# Patient Record
Sex: Male | Born: 1960
Health system: Southern US, Community
[De-identification: ages and names within clinical notes are randomized; demographics above are authoritative.]

## PROBLEM LIST (undated history)

## (undated) DIAGNOSIS — I10 Essential (primary) hypertension: Secondary | ICD-10-CM

## (undated) HISTORY — PX: APPENDECTOMY: SHX54

## (undated) HISTORY — PX: TONSILLECTOMY: SUR1361

---

## 2004-05-13 ENCOUNTER — Ambulatory Visit (HOSPITAL_COMMUNITY): Admission: RE | Admit: 2004-05-13 | Discharge: 2004-05-13 | Payer: Self-pay | Admitting: Family Medicine

## 2005-05-05 ENCOUNTER — Ambulatory Visit (HOSPITAL_COMMUNITY): Admission: RE | Admit: 2005-05-05 | Discharge: 2005-05-05 | Payer: Self-pay | Admitting: Family Medicine

## 2007-02-25 ENCOUNTER — Emergency Department (HOSPITAL_COMMUNITY): Admission: EM | Admit: 2007-02-25 | Discharge: 2007-02-25 | Payer: Self-pay | Admitting: Emergency Medicine

## 2008-11-11 ENCOUNTER — Observation Stay (HOSPITAL_COMMUNITY): Admission: EM | Admit: 2008-11-11 | Discharge: 2008-11-12 | Payer: Self-pay | Admitting: Emergency Medicine

## 2010-12-18 ENCOUNTER — Emergency Department (HOSPITAL_COMMUNITY)
Admission: EM | Admit: 2010-12-18 | Discharge: 2010-12-18 | Disposition: A | Discharge status: Home / Self Care | Payer: BC Managed Care – PPO | Attending: Emergency Medicine | Admitting: Emergency Medicine

## 2010-12-18 ENCOUNTER — Emergency Department (HOSPITAL_COMMUNITY): Payer: BC Managed Care – PPO

## 2010-12-18 DIAGNOSIS — R079 Chest pain, unspecified: Secondary | ICD-10-CM | POA: Insufficient documentation

## 2010-12-18 DIAGNOSIS — R1013 Epigastric pain: Secondary | ICD-10-CM | POA: Insufficient documentation

## 2010-12-18 LAB — BASIC METABOLIC PANEL
Chloride: 97 mEq/L (ref 96–112)
Creatinine, Ser: 0.83 mg/dL (ref 0.4–1.5)
GFR calc non Af Amer: >60 mL/min (ref >60)

## 2010-12-18 LAB — LIPASE, BLOOD: Lipase: 20 U/L (ref 11–59)

## 2010-12-18 LAB — DIFFERENTIAL
Basophils Absolute: 0 10*3/uL (ref 0.0–0.1)
Basophils Relative: 0 % (ref 0–1)
Lymphocytes Relative: 25 % (ref 12–46)
Neutro Abs: 6.3 10*3/uL (ref 1.7–7.7)

## 2010-12-18 LAB — POCT CARDIAC MARKERS
Myoglobin, poc: 79.3 ng/mL (ref 12–200)
Myoglobin, poc: 68.7 ng/mL (ref 12–200)

## 2010-12-18 LAB — HEPATIC FUNCTION PANEL
ALT: 25 U/L (ref 0–53)
AST: 22 U/L (ref 0–37)
Albumin: 3.8 g/dL (ref 3.5–5.2)
Indirect Bilirubin: 0.5 mg/dL (ref 0.3–0.9)
Total Protein: 6.9 g/dL (ref 6.0–8.3)

## 2010-12-18 LAB — CBC
HCT: 42.2 % (ref 39.0–52.0)
Hemoglobin: 14.7 g/dL (ref 13.0–17.0)
RBC: 4.95 MIL/uL (ref 4.22–5.81)
RDW: 12.8 % (ref 11.5–15.5)
WBC: 10 10*3/uL (ref 4.0–10.5)

## 2011-01-11 LAB — POCT CARDIAC MARKERS
CKMB, poc: <1 ng/mL — ABNORMAL LOW (ref 1.0–8.0)
Myoglobin, poc: 46.5 ng/mL (ref 12–200)
Myoglobin, poc: 52.9 ng/mL (ref 12–200)
Troponin i, poc: <0.05 ng/mL (ref 0.00–0.09)

## 2011-01-11 LAB — DIFFERENTIAL
Basophils Absolute: 0 10*3/uL (ref 0.0–0.1)
Basophils Relative: 1 % (ref 0–1)
Eosinophils Relative: 2 % (ref 0–5)
Lymphs Abs: 2.9 10*3/uL (ref 0.7–4.0)
Monocytes Absolute: 0.9 10*3/uL (ref 0.1–1.0)
Neutrophils Relative %: 59 % (ref 43–77)

## 2011-01-11 LAB — COMPREHENSIVE METABOLIC PANEL
Albumin: 3.9 g/dL (ref 3.5–5.2)
BUN: 12 mg/dL (ref 6–23)
Creatinine, Ser: 0.7 mg/dL (ref 0.4–1.5)
GFR calc non Af Amer: >60 mL/min (ref >60)
Potassium: 4 mEq/L (ref 3.5–5.1)
Total Protein: 7.3 g/dL (ref 6.0–8.3)

## 2011-01-11 LAB — TROPONIN I: Troponin I: <0.01 ng/mL (ref 0.00–0.06)

## 2011-01-11 LAB — LIPID PANEL
Cholesterol: 180 mg/dL (ref 0–200)
HDL: 26 mg/dL — ABNORMAL LOW (ref >39)
Total CHOL/HDL Ratio: 6.9 RATIO
Triglycerides: 171 mg/dL — ABNORMAL HIGH (ref <150)

## 2011-01-11 LAB — CBC
Hemoglobin: 14.7 g/dL (ref 13.0–17.0)
Hemoglobin: 14.8 g/dL (ref 13.0–17.0)
MCHC: 34 g/dL (ref 30.0–36.0)
MCHC: 34.5 g/dL (ref 30.0–36.0)
MCV: 87.2 fL (ref 78.0–100.0)
RBC: 4.88 MIL/uL (ref 4.22–5.81)
RBC: 4.89 MIL/uL (ref 4.22–5.81)

## 2011-01-11 LAB — TSH: TSH: 0.658 u[IU]/mL (ref 0.350–4.500)

## 2011-01-11 LAB — POCT I-STAT, CHEM 8
BUN: 13 mg/dL (ref 6–23)
Calcium, Ion: 1.2 mmol/L (ref 1.12–1.32)
Chloride: 103 mEq/L (ref 96–112)
HCT: 44 % (ref 39.0–52.0)
Potassium: 3.8 mEq/L (ref 3.5–5.1)
Sodium: 141 mEq/L (ref 135–145)

## 2011-01-11 LAB — CARDIAC PANEL(CRET KIN+CKTOT+MB+TROPI)
Relative Index: INVALID (ref 0.0–2.5)
Total CK: 71 U/L (ref 7–232)

## 2011-01-11 LAB — PROTIME-INR
INR: 1 (ref 0.00–1.49)
Prothrombin Time: 12.9 seconds (ref 11.6–15.2)

## 2011-01-11 LAB — BASIC METABOLIC PANEL
CO2: 30 mEq/L (ref 19–32)
Calcium: 9.2 mg/dL (ref 8.4–10.5)
Creatinine, Ser: 0.73 mg/dL (ref 0.4–1.5)
Glucose, Bld: 107 mg/dL — ABNORMAL HIGH (ref 70–99)

## 2011-01-11 LAB — URINALYSIS, ROUTINE W REFLEX MICROSCOPIC
Bilirubin Urine: NEGATIVE
Hgb urine dipstick: NEGATIVE
Protein, ur: NEGATIVE mg/dL
Urobilinogen, UA: 0.2 mg/dL (ref 0.0–1.0)

## 2011-01-11 LAB — MAGNESIUM: Magnesium: 2.2 mg/dL (ref 1.5–2.5)

## 2011-01-11 LAB — D-DIMER, QUANTITATIVE: D-Dimer, Quant: <0.22 ug/mL-FEU (ref 0.00–0.48)

## 2011-01-11 LAB — BRAIN NATRIURETIC PEPTIDE: Pro B Natriuretic peptide (BNP): <30 pg/mL (ref 0.0–100.0)

## 2011-01-11 LAB — HEMOGLOBIN A1C: Hgb A1c MFr Bld: 5.5 % (ref 4.6–6.1)

## 2011-02-08 NOTE — H&P (Signed)
NAMEARTHURO, CANELO              ACCOUNT NO.:  [id number]   MEDICAL RECORD NO.:  [id number]          PATIENT TYPE:  OBV   LOCATION:  1843                         FACILITY:  [facility]   PHYSICIAN:  Richard A. Alanda Amass, M.D.DATE OF BIRTH:  November 16, 1960   DATE OF ADMISSION:  11/11/2008  DATE OF DISCHARGE:                              HISTORY & PHYSICAL   CHIEF COMPLAINT:  Cold symptoms and chest pain.   HISTORY OF PRESENT ILLNESS:  A 50 year old white married male presented  to Dr. Sharyon Medicus office today secondary to cold symptoms and hypertension.  He has had a cold since Friday and started Zithromax pack on Sunday and  went to work today at [employer] for [employer].  He had a headache.  Blood pressure was found to be elevated at 170/104.  They sent him to  Dr. Sharyon Medicus office for further evaluation.  There was told he had  probable sinusitis type upper respiratory.  He was given a breathing  treatment, and then Dr. Sherwood Gambler went to examine him, would take a deep  breath and he had this deep discomfort midsternal slightly to the right.  It ileum was only with deep breath.  No associated nausea, vomiting or  diaphoresis.  Because of that and some abnormal questionable J-point  elevation in leads V2 and V3, Dr. Tresa Endo was contacted, and the patient  was sent to the emergency room at [facility] by EMS today.  He received  3 baby aspirin at Dr. Sharyon Medicus office.  He had already taken 1 baby  aspirin earlier today.  Other recent history, he has been somewhat short  of breath with exertion which he relates to deconditioning.  He had lost  some weight and had been eating more appropriately but recently a grease  and not exercising.   PAST MEDICAL HISTORY:  He is legally blind.  He has been evaluated at  [facility].  They are unsure why he is losing eyesight since 2002.  They  had done a stress test at some point, and he was never told he had a  problem.  He also does have hypertension, and his total  cholesterol was  203.  He was put on medications but he has never taken them.  No  diabetes.  No CVAs.   PAST SURGICAL HISTORY:  Includes appendectomy, tonsils and adenoids as a  teenager.  He had a fractured skull.  He had [facility] Spotted  Fever but otherwise fairly healthy except for his hypertension and  tobacco use.  Also, he does have ADHD and is on medication to help him  focus.   FAMILY HISTORY:  Mother is living; she has hypertension.  No coronary  disease.  Father did have an MI at 4; history of CVAs and hypertension  and diabetes; he died in his 58s.  He has 1 brothers with hypertension  and 1 sister who at some point had a murmur but currently is stable.   SOCIAL HISTORY:  Married.  No children.  Does not use any illicit drugs.  No alcohol use.  He does smoke 1 pack per  day since he was a teenager  works.  Works at [employer] for [employer], but it is light physical  work.  Again, he was exercising but has not in approximately 1 year.   ALLERGIES:  No known allergies.   OUTPATIENT MEDICINES:  1. Centrum 1 daily.  2. Strattera 60 mg daily.  3. Lisinopril HCT 20/12.5 b.i.d.  4. Z-Pak this week.  5. Aspirin 81 mg daily.  6. Additionally, he did take some Sudafed on Saturday.   REVIEW OF SYSTEMS:  GENERAL:  Some cold symptoms and headache with  hypertension, history of headaches as well.  HEENT:  Cold symptoms.  NEUROLOGICAL:  No syncope but positive headache.  GASTROINTESTINAL:  No  melena, rare indigestion, no diarrhea or constipation.  GENITOURINARY:  No hematuria, dysuria.  MUSCULOSKELETAL:  Negative for arthritic pains.  ENDOCRINE:  No diabetes or thyroid disease that he is aware of.  SKIN:  Without rashes.  CARDIOVASCULAR:  As stated.  PULMONARY:  As stated.   PHYSICAL EXAMINATION:  Temperature 96.6, pulse 86, respiratory rate 18,  blood pressure 136/90 here at [facility].  Oxygen saturation room air  was 100%.  GENERAL:  Alert, oriented, white male in  no acute distress, pleasant  affect.  HEENT:  Normocephalic.  Sclera clear.  Nose is congested sounding.  No  color in mucous.  Will return to evaluate TMs and pharynx once otoscope  can be attained in the ER.  NECK:  Supple.  No JVD.  No bruits.  LUNGS:  Without wheezes currently.  HEART:  Sounds S1-S2, regular rate and rhythm without murmur, gallop,  rub or click.  ABDOMEN:  Soft, nontender, positive bowel sounds.  Could not palpate  liver, spleen or masses.  LOWER EXTREMITIES:  Without edema.  Pedal pulses are 2+.   LABORATORY VALUES:  Pro time 12.9, INR of 1.6.  Sodium 141, potassium  3.8, BUN 13, creatinine 0.90, glucose 93.  Myoglobin 52.9, CK-MB less  1.0, troponin I less than 0.05.  EKG:  Sinus rhythm with early  repolarization, both at Dr. Sharyon Medicus office and one here in the emergency  room.  Will repeat the one in the emergency room to verify no acute  changes.   IMPRESSION:  1. Chest pain, rule out cardiac versus pleural pain.  2. Sinusitis/upper respiratory infection.  3. Hypertension.   Admit to telemetry bed.  IV heparin, IV nitroglycerin, serial CK-MBs and  Avelox p.o. daily.  If cardiac enzymes are negative, decisions will be  made on February 17 whether cardiac catheterization or stress test as an  outpatient.   Dr. Alanda Amass saw him and assessed him with me.      Darcella Gasman. [place], N.P.      Richard A. Alanda Amass, M.D.  Electronically Signed    LRI/MEDQ  D:  11/11/2008  T:  11/11/2008  Job:  [id number]   cc:   Gerlene Burdock A. Alanda Amass, M.D.  Madelin Rear. Sherwood Gambler, MD

## 2011-02-11 NOTE — Discharge Summary (Signed)
Frank White, Frank White              ACCOUNT NO.:  [id number]   MEDICAL RECORD NO.:  [id number]          PATIENT TYPE:  OBV   LOCATION:  2505                         FACILITY:  [facility]   PHYSICIAN:  Richard A. Alanda Amass, M.D.DATE OF BIRTH:  02-Jun-1961   DATE OF ADMISSION:  11/11/2008  DATE OF DISCHARGE:  11/12/2008                               DISCHARGE SUMMARY   DISCHARGE DIAGNOSES:  1. Chest pain pleuritic secondary to upper respiratory infection,      bronchitis.  2. Upper respiratory infection, bronchitis, treated with Avelox.  3. Positive tobacco use.  4. Hyperlipidemia.  5. Legally blind.  6. Family history of coronary artery disease.  7. Hypokalemia, replaced.  8. Hypertension.  9. Negative myocardial infarction and cardiac eval as outpatient.   DISCHARGE CONDITION:  Improved.   PROCEDURES:  None.   DISCHARGE MEDICATIONS:  1. Aspirin 81 mg daily.  2. Lisinopril and hydrochlorothiazide 20/12.5 twice a day.  3. Strattera 60 mg daily.  4. Multivitamin daily.  5. Butalbital/APAP 325 as needed for migraines.  Stop Z-Pak.  6. Avelox 400 mg 1 daily until gone.  7. Zocor 20 mg daily for cholesterol.  8. Protonix 40 mg daily.  9. Lopressor 50 mg tablet half a tablet twice a day for blood      pressure.  10.Flonase nasal spray 2 sprays each nostril daily for congestion.   DISCHARGE INSTRUCTIONS:  1. Return to work on November 17, 2008.  2. Low-sodium heart-healthy diet.  3. Increase activity slowly, otherwise no restrictions.  4. Followup with Dr. Alanda Amass in [place] on December 01, 2008, at 9:30      a.m.  5. You are scheduled for [facility] on Thursday, November 20, 2008, at 8:15 a.m.  Do not eat or drink after midnight or the night      before the test.  No caffeine the 2 days before the test.  No decaf      coffee either.  Do not wear strong smelling deodorant, no cologne.   HISTORY OF PRESENT ILLNESS:  Please see dictated H and P, a 50 year old  white  married male, presented to Dr. Sharyon Medicus office secondary to call  symptoms.  He took a deep breath during the exam and had midsternal  chest pain.  No associated symptoms.  He was sent to [facility] for  further evaluation due to the patient's hypertension,  hypercholesterolemia, and coronary artery disease.  He was seen and  examined by Dr. Alanda Amass.  We admitted him for observation and to rule  out MI.   PAST MEDICAL HISTORY:  1. Legally blind.  2. Recent upper respiratory infection.  3. History of hypercholesterolemia.  4. History of skull fracture.  5. History of [facility] spotted fever.  Stress test in the past      have been negative.   FAMILY HISTORY/SOCIAL HISTORY/REVIEW OF SYSTEMS:  See H and P.   PHYSICAL EXAMINATION AT DISCHARGE:  VITAL SIGNS:  Blood pressure 136/77,  pulse 61, respirations 20, temperature 97.3, and oxygen saturation on  room air 95%.  HEART:  S1  and S2.  Regular rate and rhythm.  LUNGS:  Positive for rhonchi.  ABDOMEN:  Positive bowel sounds, soft, and nontender.  EXTREMITIES:  No edema.   LABORATORY VALUES:  Hemoglobin 14.8, hematocrit 43.4, platelets 213, WBC  9.7, it did spike up to 14 prior to discharge, neutrophils 59, lymphs  30, monos 9, eos 2, and baso 1.  Protime 12.9, INR of 1, and PTT 33.  D-  dimer was negative at less than 0.22.  Sodium 141, potassium 3.8,  chloride 103, CO2 of 30, glucose 77, BUN 12, and creatinine 0.70.  Total  protein 7.3, albumin 3.9, AST 16, ALT 80, alkaline phos 64, total bili  0.5, magnesium 2.2, and glycohemoglobin 5.5.   Cardiac enzymes were all negative.  CK 57, 71, 64.  MB 0.8-0.7.  Troponin I less than 0.01.  BNP was less than 30.  Total cholesterol  180, triglycerides 171, HDL 26, and LDL 120.  TSH 0.658.  UA was clear.   EKG revealed sinus bradycardia without acute changes.   HOSPITAL COURSE:  The patient was admitted by Dr. Alanda Amass to rule out  MI.  He was placed on nitroglycerin drip and  Lovenox.  By the next  morning, his cardiac enzymes were negative.  He did not have any further  chest pain and nitro was discontinued.  He was ambulated in the hall.  He had tobacco cessation consult as well and he was without complaints  later in the day and was discharged home by Dr. Elsie Lincoln.  He will follow  up with Dr. Alanda Amass as instructed.      Darcella Gasman. [place], N.P.      Richard A. Alanda Amass, M.D.  Electronically Signed    LRI/MEDQ  D:  12/02/2008  T:  12/03/2008  Job:  [id number]   cc:   Madelin Rear. Sherwood Gambler, MD

## 2012-09-04 ENCOUNTER — Emergency Department (HOSPITAL_COMMUNITY): Payer: BC Managed Care – PPO

## 2012-09-04 ENCOUNTER — Encounter (HOSPITAL_COMMUNITY): Payer: Self-pay

## 2012-09-04 ENCOUNTER — Emergency Department (HOSPITAL_COMMUNITY)
Admission: EM | Admit: 2012-09-04 | Discharge: 2012-09-04 | Disposition: A | Discharge status: Home / Self Care | Payer: BC Managed Care – PPO | Attending: Emergency Medicine | Admitting: Emergency Medicine

## 2012-09-04 DIAGNOSIS — IMO0002 Reserved for concepts with insufficient information to code with codable children: Secondary | ICD-10-CM | POA: Insufficient documentation

## 2012-09-04 DIAGNOSIS — Y9389 Activity, other specified: Secondary | ICD-10-CM | POA: Insufficient documentation

## 2012-09-04 DIAGNOSIS — W298XXA Contact with other powered powered hand tools and household machinery, initial encounter: Secondary | ICD-10-CM | POA: Insufficient documentation

## 2012-09-04 DIAGNOSIS — Y929 Unspecified place or not applicable: Secondary | ICD-10-CM | POA: Insufficient documentation

## 2012-09-04 DIAGNOSIS — R110 Nausea: Secondary | ICD-10-CM | POA: Insufficient documentation

## 2012-09-04 DIAGNOSIS — I10 Essential (primary) hypertension: Secondary | ICD-10-CM | POA: Insufficient documentation

## 2012-09-04 DIAGNOSIS — R61 Generalized hyperhidrosis: Secondary | ICD-10-CM | POA: Insufficient documentation

## 2012-09-04 DIAGNOSIS — F17200 Nicotine dependence, unspecified, uncomplicated: Secondary | ICD-10-CM | POA: Insufficient documentation

## 2012-09-04 DIAGNOSIS — Z23 Encounter for immunization: Secondary | ICD-10-CM | POA: Insufficient documentation

## 2012-09-04 HISTORY — DX: Essential (primary) hypertension: I10

## 2012-09-04 MED ORDER — TETANUS-DIPHTH-ACELL PERTUSSIS 5-2.5-18.5 LF-MCG/0.5 IM SUSP
0.5000 mL | Freq: Once | INTRAMUSCULAR | Status: AC
  Administered 2012-09-04: 0.5 mL via INTRAMUSCULAR
  Filled 2012-09-04: qty 0.5

## 2012-09-04 MED ORDER — OXYCODONE-ACETAMINOPHEN 5-325 MG PO TABS: 2.0000 | ORAL_TABLET | ORAL | Status: DC | PRN

## 2012-09-04 MED ORDER — CEPHALEXIN 500 MG PO CAPS: 500.0000 mg | ORAL_CAPSULE | Freq: Four times a day (QID) | ORAL | Status: DC

## 2012-09-04 MED ORDER — FENTANYL CITRATE 0.05 MG/ML IJ SOLN
50.0000 ug | Freq: Once | INTRAMUSCULAR | Status: AC
  Administered 2012-09-04: 50 ug via INTRAVENOUS
  Filled 2012-09-04: qty 2

## 2012-09-04 MED ORDER — CEFAZOLIN SODIUM 1-5 GM-% IV SOLN
1.0000 g | Freq: Once | INTRAVENOUS | Status: AC
  Administered 2012-09-04: 1 g via INTRAVENOUS
  Filled 2012-09-04: qty 50

## 2012-09-04 MED ORDER — SODIUM CHLORIDE 0.9 % IV BOLUS (SEPSIS)
1000.0000 mL | Freq: Once | INTRAVENOUS | Status: AC
  Administered 2012-09-04: 1000 mL via INTRAVENOUS

## 2012-09-04 NOTE — ED Provider Notes (Signed)
History     CSN: [id number]  Arrival date & time 09/04/12  2011   First MD Initiated Contact with Patient 09/04/12 2022      Chief Complaint  Patient presents with  . Finger Laceration      Patient is a 51 y.o. male presenting with hand pain. The history is provided by the patient.  Hand Pain This is a new problem. The current episode started less than 1 hour ago. The problem occurs constantly. The problem has not changed since onset.Pertinent negatives include no abdominal pain and no shortness of breath. Nothing aggravates the symptoms. Nothing relieves the symptoms. He has tried rest for the symptoms. The treatment provided mild relief.  PT reports he cut his left middle finger with a table saw He reports prior to the injury he felt well  He now reports nausea/diaphoresis after seeing the blood. He is not on anticoagulants  Past Medical History  Diagnosis Date  . Hypertension     History reviewed. No pertinent past surgical history.  No family history on file.  History  Substance Use Topics  . Smoking status: Current Every Day Smoker    Types: Cigarettes  . Smokeless tobacco: Not on file  . Alcohol Use: No      Review of Systems  Constitutional: Positive for diaphoresis. Negative for fever.  Respiratory: Negative for shortness of breath.   Gastrointestinal: Negative for abdominal pain.  Musculoskeletal: Negative for joint swelling.  Skin: Positive for wound.  Neurological: Positive for dizziness. Negative for weakness.  Psychiatric/Behavioral: Negative for agitation.  All other systems reviewed and are negative.    Allergies  Review of patient's allergies indicates no known allergies.  Home Medications  No current outpatient prescriptions on file.  BP 91/57  Pulse 44  Temp 97.8 F (36.6 C) (Oral)  Resp 20  Ht 5\' 9"  (1.753 m)  Wt 184 lb (83.462 kg)  BMI 27.17 kg/m2  SpO2 98%  Physical Exam CONSTITUTIONAL: Well developed/well nourished HEAD AND  FACE: Normocephalic/atraumatic EYES: EOMI/PERRL ENMT: Mucous membranes moist NECK: supple no meningeal signs CV: S1/S2 noted, no murmurs/rubs/gallops noted LUNGS: Lungs are clear to auscultation bilaterally, no apparent distress ABDOMEN: soft, nontender, no rebound or guarding GU:no cva tenderness NEURO: Pt is awake/alert, moves all extremitiesx4 EXTREMITIES: pulses normal, full ROM Laceration with partial amputation of distal tip of left middle finger.  He has part of his nail remaining.  Bleeding controlled.  No bony tenderness to the rest of the finger.  No other hand injury is noted SKIN: warm, color normal PSYCH: no abnormalities of mood noted  ED Course  Procedures   8:54 PM Pt with injury to left middle finger, bleeding controlled Xray pending He does not want pain meds 10:03 PM Pt stable Vitals improved Seen by dr Romeo Apple but he would like consultation with [facility] signout, f/u with consultant for disposition   MDM  Nursing notes including past medical history and social history reviewed and considered in documentation xrays reviewed and considered         Joya Gaskins, MD 09/04/12 2204

## 2012-09-04 NOTE — ED Notes (Addendum)
Using a table saw and cut the end of my left 3rd finger off per pt. Bleeding controlled at this time. Patient diaphoretic and feels like he is going to pass out. Patient does not have the end of his finger with him.

## 2012-09-04 NOTE — ED Provider Notes (Signed)
Discussed with Dr. Romeo Apple. Patient will be seen by him tomorrow. He recommends Xeroform dressing, splint, pain medication and antibiotics. Patient in agreement.  Glynn Octave, MD 09/04/12 [phone]

## 2012-09-05 ENCOUNTER — Ambulatory Visit (INDEPENDENT_AMBULATORY_CARE_PROVIDER_SITE_OTHER): Payer: BC Managed Care – PPO | Admitting: Orthopedic Surgery

## 2012-09-05 ENCOUNTER — Encounter: Payer: BC Managed Care – PPO | Admitting: Orthopedic Surgery

## 2012-09-05 ENCOUNTER — Encounter (HOSPITAL_COMMUNITY): Payer: Self-pay | Admitting: Pharmacy Technician

## 2012-09-05 ENCOUNTER — Encounter: Payer: Self-pay | Admitting: Orthopedic Surgery

## 2012-09-05 ENCOUNTER — Telehealth: Payer: Self-pay | Admitting: Orthopedic Surgery

## 2012-09-05 VITALS — BP 130/88 | Ht 69.0 in | Wt 187.0 lb

## 2012-09-05 DIAGNOSIS — S68119A Complete traumatic metacarpophalangeal amputation of unspecified finger, initial encounter: Secondary | ICD-10-CM

## 2012-09-05 DIAGNOSIS — IMO0002 Reserved for concepts with insufficient information to code with codable children: Secondary | ICD-10-CM

## 2012-09-05 DIAGNOSIS — S52509A Unspecified fracture of the lower end of unspecified radius, initial encounter for closed fracture: Secondary | ICD-10-CM

## 2012-09-05 NOTE — Telephone Encounter (Signed)
Regarding out-patient surgery scheduled 09/07/12 at [facility], contacted insurer [facility], ph# [phone].  Reference CPT codes 19147, 26951, with ICD9 code 886.0 - Per Silvio Clayman, no pre-authorization is required for out-patient procedures listed.  Her name and date, 09/05/12, 1:52p.m.

## 2012-09-05 NOTE — Patient Instructions (Signed)
   You have been scheduled for surgery on the LEFT long finger to revise the partial fingertip amputation.  Potential complications of the surgery include, but are not limited to, altered sensation, altered flexion and extension of the digit, chronic pain, and cosmetic deformity, infection.  Please stop all blood thinners, which include medications, such as ibuprofen or Advil and aspirin.

## 2012-09-05 NOTE — Patient Instructions (Addendum)
20 [name]  09/05/2012   Your procedure is scheduled on:  09/07/2012  Report to [facility] at  615  AM.  Call this number if you have problems the morning of surgery: [phone]   Remember:   Do not eat food:After Midnight.  May have clear liquids:until Midnight .    Take these medicines the morning of surgery with A SIP OF WATER:  Percocet,lisinopril   Do not wear jewelry, make-up or nail polish.  Do not wear lotions, powders, or perfumes.   Do not shave 48 hours prior to surgery. Men may shave face and neck.  Do not bring valuables to the hospital.  Contacts, dentures or bridgework may not be worn into surgery.  Leave suitcase in the car. After surgery it may be brought to your room.  For patients admitted to the hospital, checkout time is 11:00 AM the day of discharge.   Patients discharged the day of surgery will not be allowed to drive home.  Name and phone number of your driver: family  Special Instructions: Shower using CHG 2 nights before surgery and the night before surgery.  If you shower the day of surgery use CHG.  Use special wash - you have one bottle of CHG for all showers.  You should use approximately 1/3 of the bottle for each shower.   Please read over the following fact sheets that you were given: Pain Booklet, Coughing and Deep Breathing, MRSA Information, Surgical Site Infection Prevention, Anesthesia Post-op Instructions and Care and Recovery After Surgery Fingertip Injuries and Amputations Fingertip injuries are common and often get injured because they are last to escape when pulling your hand out of harm's way. You have amputated (cut off) part of your finger. How this turns out depends largely on how much was amputated. If just the tip is amputated, often the end of the finger will grow back and the finger may return to much the same as it was before the injury.  If more of the finger is missing, your caregiver has done the best with the tissue  remaining to allow you to keep as much finger as is possible. Your caregiver after checking your injury has tried to leave you with a painless fingertip that has durable, feeling skin. If possible, your caregiver has tried to maintain the finger's length and appearance and preserve its fingernail.  Please read the instructions outlined below and refer to this sheet in the next few weeks. These instructions provide you with general information on caring for yourself. Your caregiver may also give you specific instructions. While your treatment has been done according to the most current medical practices available, unavoidable complications occasionally occur. If you have any problems or questions after discharge, please call your caregiver. HOME CARE INSTRUCTIONS   You may resume normal diet and activities as directed or allowed.  Keep your hand elevated above the level of your heart. This helps decrease pain and swelling.  Keep ice packs (or a bag of ice wrapped in a towel) on the injured area for 15 to 20 minutes, 3 to 4 times per day, for the first two days.  Change dressings if necessary or as directed.  Clean the wound daily or as directed.  Only take over-the-counter or prescription medicines for pain, discomfort, or fever as directed by your caregiver.  Keep appointments as directed. SEEK IMMEDIATE MEDICAL CARE IF:  You develop redness, swelling, numbness or increasing pain in the wound.  There is pus coming from the wound.  You develop an unexplained oral temperature above 102 F (38.9 C) or as your caregiver suggests.  There is a foul (bad) smell coming from the wound or dressing.  There is a breaking open of the wound (edges not staying together) after sutures or staples have been removed. MAKE SURE YOU:   Understand these instructions.  Will watch your condition.  Will get help right away if you are not doing well or get worse. Document Released: 08/03/2005 Document  Revised: 12/05/2011 Document Reviewed: 07/02/2008 [facility] Patient Information 2013 [place], [place]. PATIENT INSTRUCTIONS POST-ANESTHESIA  IMMEDIATELY FOLLOWING SURGERY:  Do not drive or operate machinery for the first twenty four hours after surgery.  Do not make any important decisions for twenty four hours after surgery or while taking narcotic pain medications or sedatives.  If you develop intractable nausea and vomiting or a severe headache please notify your doctor immediately.  FOLLOW-UP:  Please make an appointment with your surgeon as instructed. You do not need to follow up with anesthesia unless specifically instructed to do so.  WOUND CARE INSTRUCTIONS (if applicable):  Keep a dry clean dressing on the anesthesia/puncture wound site if there is drainage.  Once the wound has quit draining you may leave it open to air.  Generally you should leave the bandage intact for twenty four hours unless there is drainage.  If the epidural site drains for more than 36-48 hours please call the anesthesia department.  QUESTIONS?:  Please feel free to call your physician or the hospital operator if you have any questions, and they will be happy to assist you.

## 2012-09-05 NOTE — Progress Notes (Signed)
Patient ID: Frank White, male   DOB: Feb 12, 1961, 51 y.o.   MRN: [id number] Chief Complaint  Patient presents with  . Hand Pain    Injury to the left middle finger, DOI 09-04-12.    Date of injury was 09/04/2012  Patient right-hand-dominant injured with table saw left long finger partial amputation dorsal oblique fingertip injury. Dull 2/10 constant pain  Review of systems he reports easy bleeding although no evidence of that is noted clinically other review of systems are normal  History as recorded  Physical Exam(12)  Vital signs:   GENERAL: normal development   CDV: pulses are normal   Skin: normal  Lymph: nodes were not palpable/normal  Psychiatric: awake, alert and oriented  Neuro: normal sensation  MSK  Gait: No abnormalities on gait analysis 1 Inspection left long finger dorsal oblique fingertip injury nail approximately 50% amputated bone is at the tip of the wound. He has intact flexion and extension at the DIP joint no residual deformity. All joints are stable  His other hand is normal  Lower extremity exam  Ambulation is normal.  The right and left lower extremity:  Inspection and palpation revealed no tenderness or abnormality in alignment in the lower extremities. Range of motion is full.  Strength is grade 5.  All joints are stable.  Diagnosis left long finger dorsal oblique fingertip amputation  Plan is for bone resection and then advancement flap versus primary closure versus healing by secondary intention, left long finger  Patient made aware of potential complications and cosmetic deformity

## 2012-09-06 ENCOUNTER — Other Ambulatory Visit: Payer: Self-pay

## 2012-09-06 ENCOUNTER — Encounter (HOSPITAL_COMMUNITY)
Admission: RE | Admit: 2012-09-06 | Discharge: 2012-09-06 | Disposition: A | Discharge status: Home / Self Care | Payer: BC Managed Care – PPO | Source: Ambulatory Visit | Attending: Orthopedic Surgery | Admitting: Orthopedic Surgery

## 2012-09-06 ENCOUNTER — Encounter (HOSPITAL_COMMUNITY): Payer: Self-pay

## 2012-09-06 LAB — HEMOGLOBIN AND HEMATOCRIT, BLOOD
HCT: 40.5 % (ref 39.0–52.0)
Hemoglobin: 14 g/dL (ref 13.0–17.0)

## 2012-09-06 LAB — BASIC METABOLIC PANEL
CO2: 28 mEq/L (ref 19–32)
Chloride: 105 mEq/L (ref 96–112)
Potassium: 4.3 mEq/L (ref 3.5–5.1)
Sodium: 140 mEq/L (ref 135–145)

## 2012-09-06 LAB — SURGICAL PCR SCREEN: Staphylococcus aureus: POSITIVE — AB

## 2012-09-06 NOTE — H&P (Signed)
  Chief Complaint   Patient presents with   .  Hand Pain     Injury to the left middle finger, DOI 09-04-12.    Date of injury was 09/04/2012   Patient right-hand-dominant injured with table saw left long finger partial amputation dorsal oblique fingertip injury. Dull 2/10 constant pain   Review of systems he reports easy bleeding although no evidence of that is noted clinically other review of systems are normal   Past Medical History  Diagnosis Date  . Hypertension     Past Surgical History  Procedure Date  . Appendectomy   . Tonsillectomy     Family History  Problem Relation Age of Onset  . Diabetes     History   Social History  . Marital Status: Married    Spouse Name: N/A    Number of Children: N/A  . Years of Education: N/A   Occupational History  . Not on file.   Social History Main Topics  . Smoking status: Current Every Day Smoker -- 1.0 packs/day    Types: Cigarettes  . Smokeless tobacco: Not on file  . Alcohol Use: No  . Drug Use: No  . Sexually Active:    Other Topics Concern  . Not on file   Social History Narrative  . No narrative on file   Physical Exam(12)  Vital signs are normal  GENERAL: normal development  CDV: pulses are normal  Skin: normal  Lymph: nodes were not palpable/normal  Psychiatric: awake, alert and oriented  Neuro: normal sensation  MSK Gait: No abnormalities on gait analysis  1 Inspection left long finger dorsal oblique fingertip injury nail approximately 50% amputated bone is at the tip of the wound. He has intact flexion and extension at the DIP joint no residual deformity. All joints are stable  His other hand is normal  Lower extremity exam  Ambulation is normal.  The right and left lower extremity:  Inspection and palpation revealed no tenderness or abnormality in alignment in the lower extremities.  Range of motion is full. Strength is grade 5.  All joints are stable.   Diagnosis left long finger dorsal  oblique fingertip amputation   Plan is for bone resection and then advancement flap versus primary closure versus healing by secondary intention, left long finger   Patient made aware of potential complications and cosmetic deformity

## 2012-09-07 ENCOUNTER — Encounter (HOSPITAL_COMMUNITY): Payer: Self-pay | Admitting: Anesthesiology

## 2012-09-07 ENCOUNTER — Ambulatory Visit (HOSPITAL_COMMUNITY): Payer: BC Managed Care – PPO | Admitting: Anesthesiology

## 2012-09-07 ENCOUNTER — Encounter (HOSPITAL_COMMUNITY): Payer: Self-pay | Admitting: *Deleted

## 2012-09-07 ENCOUNTER — Encounter (HOSPITAL_COMMUNITY)
Admission: RE | Disposition: A | Discharge status: Home / Self Care | Payer: Self-pay | Source: Ambulatory Visit | Attending: Orthopedic Surgery

## 2012-09-07 ENCOUNTER — Ambulatory Visit (HOSPITAL_COMMUNITY)
Admission: RE | Admit: 2012-09-07 | Discharge: 2012-09-07 | Disposition: A | Discharge status: Home / Self Care | Payer: BC Managed Care – PPO | Source: Ambulatory Visit | Attending: Orthopedic Surgery | Admitting: Orthopedic Surgery

## 2012-09-07 DIAGNOSIS — Z01810 Encounter for preprocedural cardiovascular examination: Secondary | ICD-10-CM | POA: Insufficient documentation

## 2012-09-07 DIAGNOSIS — I10 Essential (primary) hypertension: Secondary | ICD-10-CM | POA: Insufficient documentation

## 2012-09-07 DIAGNOSIS — Z01812 Encounter for preprocedural laboratory examination: Secondary | ICD-10-CM | POA: Insufficient documentation

## 2012-09-07 DIAGNOSIS — IMO0002 Reserved for concepts with insufficient information to code with codable children: Secondary | ICD-10-CM | POA: Insufficient documentation

## 2012-09-07 DIAGNOSIS — W298XXA Contact with other powered powered hand tools and household machinery, initial encounter: Secondary | ICD-10-CM | POA: Insufficient documentation

## 2012-09-07 DIAGNOSIS — S68119A Complete traumatic metacarpophalangeal amputation of unspecified finger, initial encounter: Secondary | ICD-10-CM

## 2012-09-07 HISTORY — PX: AMPUTATION: SHX166

## 2012-09-07 SURGERY — AMPUTATION DIGIT
Anesthesia: Regional | Site: Finger | Laterality: Left | Wound class: Clean

## 2012-09-07 MED ORDER — LIDOCAINE HCL (PF) 0.5 % IJ SOLN
INTRAMUSCULAR | Status: DC | PRN
  Administered 2012-09-07: 50 mL

## 2012-09-07 MED ORDER — FENTANYL CITRATE 0.05 MG/ML IJ SOLN
INTRAMUSCULAR | Status: AC
  Filled 2012-09-07: qty 2

## 2012-09-07 MED ORDER — MUPIROCIN 2 % EX OINT
TOPICAL_OINTMENT | Freq: Two times a day (BID) | CUTANEOUS | Status: DC
  Administered 2012-09-07: 1 via NASAL

## 2012-09-07 MED ORDER — FENTANYL CITRATE 0.05 MG/ML IJ SOLN
INTRAMUSCULAR | Status: DC | PRN
  Administered 2012-09-07: 100 ug via INTRAVENOUS

## 2012-09-07 MED ORDER — CEFAZOLIN SODIUM-DEXTROSE 2-3 GM-% IV SOLR
INTRAVENOUS | Status: AC
  Filled 2012-09-07: qty 50

## 2012-09-07 MED ORDER — SODIUM CHLORIDE 0.9 % IR SOLN
Status: DC | PRN
  Administered 2012-09-07: 1

## 2012-09-07 MED ORDER — LACTATED RINGERS IV SOLN
INTRAVENOUS | Status: DC | PRN
  Administered 2012-09-07: 07:00:00 via INTRAVENOUS

## 2012-09-07 MED ORDER — CHLORHEXIDINE GLUCONATE 4 % EX LIQD: 60.0000 mL | Freq: Once | CUTANEOUS | Status: DC

## 2012-09-07 MED ORDER — MUPIROCIN 2 % EX OINT
TOPICAL_OINTMENT | CUTANEOUS | Status: AC
  Filled 2012-09-07: qty 22

## 2012-09-07 MED ORDER — PROPOFOL 10 MG/ML IV EMUL
INTRAVENOUS | Status: AC
  Filled 2012-09-07: qty 20

## 2012-09-07 MED ORDER — BUPIVACAINE HCL 0.25 % IJ SOLN
INTRAMUSCULAR | Status: DC | PRN
  Administered 2012-09-07: 12 mL

## 2012-09-07 MED ORDER — CEFAZOLIN SODIUM-DEXTROSE 2-3 GM-% IV SOLR
INTRAVENOUS | Status: DC | PRN
  Administered 2012-09-07: 2 g via INTRAVENOUS

## 2012-09-07 MED ORDER — MIDAZOLAM HCL 2 MG/2ML IJ SOLN
INTRAMUSCULAR | Status: AC
  Filled 2012-09-07: qty 2

## 2012-09-07 MED ORDER — GLYCOPYRROLATE 0.2 MG/ML IJ SOLN
INTRAMUSCULAR | Status: AC
  Filled 2012-09-07: qty 1

## 2012-09-07 MED ORDER — ONDANSETRON HCL 4 MG/2ML IJ SOLN: 4.0000 mg | Freq: Once | INTRAMUSCULAR | Status: DC | PRN

## 2012-09-07 MED ORDER — OXYCODONE-ACETAMINOPHEN 5-325 MG PO TABS: 1.0000 | ORAL_TABLET | ORAL | Status: DC | PRN

## 2012-09-07 MED ORDER — CEFAZOLIN SODIUM-DEXTROSE 2-3 GM-% IV SOLR: 2.0000 g | INTRAVENOUS | Status: DC

## 2012-09-07 MED ORDER — GLYCOPYRROLATE 0.2 MG/ML IJ SOLN
0.2000 mg | Freq: Once | INTRAMUSCULAR | Status: AC
  Administered 2012-09-07: 0.2 mg via INTRAVENOUS

## 2012-09-07 MED ORDER — LACTATED RINGERS IV SOLN
INTRAVENOUS | Status: DC
  Administered 2012-09-07: 1000 mL via INTRAVENOUS

## 2012-09-07 MED ORDER — MIDAZOLAM HCL 2 MG/2ML IJ SOLN
1.0000 mg | INTRAMUSCULAR | Status: DC | PRN
  Administered 2012-09-07: 2 mg via INTRAVENOUS

## 2012-09-07 MED ORDER — LIDOCAINE HCL (CARDIAC) 10 MG/ML IV SOLN
INTRAVENOUS | Status: DC | PRN
  Administered 2012-09-07: 50 mg via INTRAVENOUS

## 2012-09-07 MED ORDER — PROPOFOL INFUSION 10 MG/ML OPTIME
INTRAVENOUS | Status: DC | PRN
  Administered 2012-09-07: 75 ug/kg/min via INTRAVENOUS

## 2012-09-07 MED ORDER — LIDOCAINE HCL (PF) 0.5 % IJ SOLN
INTRAMUSCULAR | Status: AC
  Filled 2012-09-07: qty 50

## 2012-09-07 MED ORDER — FENTANYL CITRATE 0.05 MG/ML IJ SOLN
25.0000 ug | INTRAMUSCULAR | Status: DC | PRN
  Administered 2012-09-07 (×2): 50 ug via INTRAVENOUS

## 2012-09-07 MED ORDER — SODIUM CHLORIDE 0.9 % IJ SOLN
INTRAMUSCULAR | Status: AC
  Filled 2012-09-07: qty 10

## 2012-09-07 MED ORDER — MIDAZOLAM HCL 5 MG/5ML IJ SOLN
INTRAMUSCULAR | Status: DC | PRN
  Administered 2012-09-07 (×2): 1 mg via INTRAVENOUS

## 2012-09-07 MED ORDER — BUPIVACAINE HCL (PF) 0.25 % IJ SOLN
INTRAMUSCULAR | Status: AC
  Filled 2012-09-07: qty 30

## 2012-09-07 MED ORDER — LIDOCAINE HCL (PF) 1 % IJ SOLN
INTRAMUSCULAR | Status: AC
  Filled 2012-09-07: qty 5

## 2012-09-07 SURGICAL SUPPLY — 39 items
BAG HAMPER (MISCELLANEOUS) ×2 IMPLANT
BANDAGE CONFORM 2  STR LF (GAUZE/BANDAGES/DRESSINGS) ×2 IMPLANT
BANDAGE ELASTIC 2 VELCRO NS LF (GAUZE/BANDAGES/DRESSINGS) ×1 IMPLANT
BANDAGE ESMARK 4X12 BL STRL LF (DISPOSABLE) ×1 IMPLANT
BANDAGE GAUZE ELAST BULKY 4 IN (GAUZE/BANDAGES/DRESSINGS) ×2 IMPLANT
BLADE SAW RECIPROCATING 77.5 (BLADE) ×1 IMPLANT
BNDG CMPR 12X4 ELC STRL LF (DISPOSABLE) ×1
BNDG ESMARK 4X12 BLUE STRL LF (DISPOSABLE) ×2
CHLORAPREP W/TINT 26ML (MISCELLANEOUS) ×1 IMPLANT
CLOTH BEACON ORANGE TIMEOUT ST (SAFETY) ×2 IMPLANT
COVER LIGHT HANDLE STERIS (MISCELLANEOUS) ×4 IMPLANT
CUFF TOURNIQUET SINGLE 18IN (TOURNIQUET CUFF) ×2 IMPLANT
DECANTER SPIKE VIAL GLASS SM (MISCELLANEOUS) ×1 IMPLANT
DRSG TEGADERM 2-3/8X2-3/4 SM (GAUZE/BANDAGES/DRESSINGS) ×1 IMPLANT
DRSG XEROFORM 1X8 (GAUZE/BANDAGES/DRESSINGS) ×1 IMPLANT
ELECT REM PT RETURN 9FT ADLT (ELECTROSURGICAL) ×2
ELECTRODE REM PT RTRN 9FT ADLT (ELECTROSURGICAL) ×1 IMPLANT
GLOVE BIOGEL PI IND STRL 7.0 (GLOVE) IMPLANT
GLOVE BIOGEL PI INDICATOR 7.0 (GLOVE) ×1
GLOVE ECLIPSE 6.5 STRL STRAW (GLOVE) ×1 IMPLANT
GLOVE SKINSENSE NS SZ8.0 LF (GLOVE) ×1
GLOVE SKINSENSE STRL SZ8.0 LF (GLOVE) ×1 IMPLANT
GLOVE SS N UNI LF 8.5 STRL (GLOVE) ×2 IMPLANT
GOWN STRL REIN XL XLG (GOWN DISPOSABLE) ×6 IMPLANT
HEMOSTAT SURGICEL 4X8 (HEMOSTASIS) ×1 IMPLANT
KIT ROOM TURNOVER APOR (KITS) ×2 IMPLANT
MANIFOLD NEPTUNE II (INSTRUMENTS) ×2 IMPLANT
NDL HYPO 21X1.5 SAFETY (NEEDLE) IMPLANT
NEEDLE HYPO 21X1.5 SAFETY (NEEDLE) ×2 IMPLANT
NS IRRIG 1000ML POUR BTL (IV SOLUTION) ×2 IMPLANT
PACK BASIC LIMB (CUSTOM PROCEDURE TRAY) ×2 IMPLANT
PAD ARMBOARD 7.5X6 YLW CONV (MISCELLANEOUS) ×2 IMPLANT
SET BASIN LINEN APH (SET/KITS/TRAYS/PACK) ×2 IMPLANT
SOL PREP PROV IODINE SCRUB 4OZ (MISCELLANEOUS) ×2 IMPLANT
SPONGE LAP 18X18 X RAY DECT (DISPOSABLE) ×2 IMPLANT
SUT BONE WAX W31G (SUTURE) ×2 IMPLANT
SUT ETHILON 3 0 FSL (SUTURE) ×2 IMPLANT
SYR 30ML LL (SYRINGE) ×1 IMPLANT
SYR BULB IRRIGATION 50ML (SYRINGE) ×2 IMPLANT

## 2012-09-07 NOTE — Transfer of Care (Signed)
  Anesthesia Post-op Note  Patient: Frank White  Procedure(s) Performed: Procedure(s) (LRB) with comments: AMPUTATION DIGIT (Left) - left long finger - revision of amputation   Patient Location: PACU  Anesthesia Type: Bier block  Level of Consciousness: awake, alert , oriented and patient cooperative  Airway and Oxygen Therapy: Patient Spontanous Breathing room air  Post-op Pain: mild  Post-op Assessment: Post-op Vital signs reviewed, Patient's Cardiovascular Status Stable, Respiratory Function Stable, Patent Airway and No signs of Nausea or vomiting  Post-op Vital Signs: Reviewed and stable  Complications: No apparent anesthesia complications

## 2012-09-07 NOTE — Interval H&P Note (Signed)
History and Physical Interval Note:  09/07/2012 7:23 AM  Frank White  has presented today for surgery, with the diagnosis of left long finger tip amputation  The various methods of treatment have been discussed with the patient and family. After consideration of risks, benefits and other options for treatment, the patient has consented to  Procedure(s) (LRB) with comments: AMPUTATION DIGIT (Left) - left long finger - revision of amputation  as a surgical intervention .  The patient's history has been reviewed, patient examined, no change in status, stable for surgery.  I have reviewed the patient's chart and labs.  Questions were answered to the patient's satisfaction.     Fuller Canada  LEFT LONG FINGER TIP REVISION OF AMP

## 2012-09-07 NOTE — Anesthesia Procedure Notes (Signed)
Anesthesia Regional Block:  Bier block (IV Regional)  Pre-Anesthetic Checklist: ,, timeout performed, Correct Patient, Correct Site, Correct Laterality, Correct Procedure,, site marked, surgical consent,, at surgeon's request  Laterality: Left  Prep: alcohol swabs       Needles:  Injection technique: Single-shot  Needle Type: Other      Needle Gauge: 22 and 22 G    Additional Needles: Bier block (IV Regional)  Nerve Stimulator or Paresthesia:   Additional Responses:  Pulse checked post tourniquet inflation. IV NSL discontinued post injection. Narrative:  Start time: 09/07/2012 7:52 AM  Performed by: Personally   Bier block (IV Regional)

## 2012-09-07 NOTE — Anesthesia Preprocedure Evaluation (Signed)
Anesthesia Evaluation  Patient identified by MRN, date of birth, ID band Patient awake    Reviewed: Allergy & Precautions, H&P , NPO status , Patient's Chart, lab work & pertinent test results  Airway Mallampati: I TM Distance: >3 FB Neck ROM: Full    Dental  (+) Missing   Pulmonary neg pulmonary ROS,    Pulmonary exam normal       Cardiovascular hypertension, Pt. on medications Rhythm:Regular Rate:Bradycardia     Neuro/Psych negative psych ROS   GI/Hepatic negative GI ROS, Neg liver ROS,   Endo/Other  negative endocrine ROS  Renal/GU negative Renal ROS  negative genitourinary   Musculoskeletal negative musculoskeletal ROS (+)   Abdominal Normal abdominal exam  (+)   Peds  Hematology negative hematology ROS (+)   Anesthesia Other Findings   Reproductive/Obstetrics                           Anesthesia Physical Anesthesia Plan  ASA: II  Anesthesia Plan: Bier Block   Post-op Pain Management:    Induction: Intravenous  Airway Management Planned: Nasal Cannula  Additional Equipment:   Intra-op Plan:   Post-operative Plan:   Informed Consent: I have reviewed the patients History and Physical, chart, labs and discussed the procedure including the risks, benefits and alternatives for the proposed anesthesia with the patient or authorized representative who has indicated his/her understanding and acceptance.     Plan Discussed with: CRNA  Anesthesia Plan Comments:         Anesthesia Quick Evaluation

## 2012-09-07 NOTE — Brief Op Note (Signed)
09/07/2012  8:21 AM  PATIENT:  Larena Glassman  51 y.o. male  PRE-OPERATIVE DIAGNOSIS:  left long finger tip amputation  POST-OPERATIVE DIAGNOSIS:  left long finger tip amputation  PROCEDURE:  Procedure(s) (LRB) with comments: AMPUTATION DIGIT (Left) - left long finger - revision of amputation   SURGEON:  Surgeon(s) and Role:    * Vickki Hearing, MD - Primary  PHYSICIAN ASSISTANT:   ASSISTANTS: none   ANESTHESIA:   regional  EBL:  Total I/O In: 1100 [I.V.:1100] Out: 0   BLOOD ADMINISTERED:none  DRAINS: none   LOCAL MEDICATIONS USED:  MARCAINE    and Amount: 12 ml  SPECIMEN:  No Specimen  DISPOSITION OF SPECIMEN:  N/A  COUNTS:  YES  TOURNIQUET:   Total Tourniquet Time Documented: Upper Arm (Left) - 30 minutes  DICTATION: .Dragon Dictation  PLAN OF CARE: Discharge to home after PACU  PATIENT DISPOSITION:  PACU - hemodynamically stable.   Delay start of Pharmacological VTE agent (>24hrs) due to surgical blood loss or risk of bleeding: not applicable

## 2012-09-07 NOTE — Anesthesia Postprocedure Evaluation (Signed)
  Anesthesia Post-op Note  Patient: Frank White  Procedure(s) Performed: Procedure(s) (LRB) with comments: AMPUTATION DIGIT (Left) - left long finger - revision of amputation   Patient Location: PACU  Anesthesia Type: MAC  Level of Consciousness: awake, alert , oriented and patient cooperative  Airway and Oxygen Therapy: Patient Spontanous Breathing  Room air  Post-op Pain: mild  Post-op Assessment: Post-op Vital signs reviewed, Patient's Cardiovascular Status Stable, Respiratory Function Stable, Patent Airway and No signs of Nausea or vomiting  Post-op Vital Signs: Reviewed and stable  Complications: No apparent anesthesia complications

## 2012-09-07 NOTE — Preoperative (Signed)
Beta Blockers   Reason not to administer Beta Blockers:Not Applicable 

## 2012-09-07 NOTE — Op Note (Signed)
Brief op note incorporated by reference  Details of procedure the left long finger was identified and marked in the preop area, proper charting update was completed  The patient was taken to the operating room and a Bier block was established. He was in the supine position.  After sterile prep and drape, timeout was completed  The partial amputation was irrigated and debrided the bone was Rogeured back to the appropriate level. Further irrigation and debridement was completed. The dorsal oblique and ulnar oblique fingertip was then freshened with sharp dissection and converted to a transverse amputation. 3 3-0 nylon sutures were then easily used to close the wound  Moist Xeroform dressing followed by moist 2 x 2 and dry 2 x 2 and 2 inch Kling was used for dressing with a finger cot placed over the top  Note we did use 12 cc of 0.5% plain Marcaine as a digital block  The tourniquet was released the patient was taken to the postoperative area for recovery  Followup in 6 days for dressing change

## 2012-09-10 ENCOUNTER — Encounter (HOSPITAL_COMMUNITY): Payer: Self-pay | Admitting: Orthopedic Surgery

## 2012-09-13 ENCOUNTER — Encounter: Payer: Self-pay | Admitting: Orthopedic Surgery

## 2012-09-13 ENCOUNTER — Ambulatory Visit (INDEPENDENT_AMBULATORY_CARE_PROVIDER_SITE_OTHER): Payer: BC Managed Care – PPO | Admitting: Orthopedic Surgery

## 2012-09-13 VITALS — BP 110/70 | Ht 69.0 in | Wt 187.0 lb

## 2012-09-13 DIAGNOSIS — S68119A Complete traumatic metacarpophalangeal amputation of unspecified finger, initial encounter: Secondary | ICD-10-CM

## 2012-09-13 DIAGNOSIS — IMO0002 Reserved for concepts with insufficient information to code with codable children: Secondary | ICD-10-CM

## 2012-09-13 NOTE — Patient Instructions (Signed)
Keep clean and dry

## 2012-09-13 NOTE — Progress Notes (Signed)
Patient ID: Frank White, male   DOB: 1961/07/24, 51 y.o.   MRN: [id number] Chief Complaint  Patient presents with  . Follow-up    post op check left hand    The patient had a partial amputation of his left long finger and had revision of amputation with wound closure on the 13th. He presents back today for further evaluation his first postop visit.  His incision looks good the suture line looks good the finger looks good the wound looks good  We recommend sutures to come out in a week and then start dressing changes through the hospital therapy department

## 2012-09-20 ENCOUNTER — Encounter: Payer: Self-pay | Admitting: Orthopedic Surgery

## 2012-09-20 ENCOUNTER — Ambulatory Visit (INDEPENDENT_AMBULATORY_CARE_PROVIDER_SITE_OTHER): Payer: BC Managed Care – PPO | Admitting: Orthopedic Surgery

## 2012-09-20 VITALS — BP 120/70 | Ht 69.0 in | Wt 187.0 lb

## 2012-09-20 DIAGNOSIS — S68119A Complete traumatic metacarpophalangeal amputation of unspecified finger, initial encounter: Secondary | ICD-10-CM

## 2012-09-20 DIAGNOSIS — IMO0002 Reserved for concepts with insufficient information to code with codable children: Secondary | ICD-10-CM

## 2012-09-20 NOTE — Progress Notes (Signed)
Patient ID: Frank White, male   DOB: 1961-09-02, 51 y.o.   MRN: [id number] Chief Complaint  Patient presents with  . Follow-up    post op and stitches removed/ DOS 12.13.2013    1. Traumatic amputation of fingertip     Diagnoses     Traumatic amputation of fingertip   - Primary    886.0      Reason for Visit     Follow-up    post op check left hand       Vitals - Last Recorded       BP  Ht  Wt  BMI          110/70  5\' 9"  (1.753 m)  187 lb (84.823 kg)  27.62 kg/m2           Progress Notes     Fuller Canada, MD  09/13/2012 2:38 PM  Signed  Patient ID: Frank White, male   DOB: Nov 14, 1960, 51 y.o.   MRN: [id number] Chief Complaint   Patient presents with   .  Follow-up       post op check left hand     The patient had a partial amputation of his left long finger and had revision of amputation with wound closure on the    13th of December  His wound looks good. His sutures are removed. He has hypersensitive tip. No tension was noted on the suture line has good color, normal capillary refill.  When he returns next time we can start dressing changes with soaking and simple Band-Aid covering.

## 2012-09-20 NOTE — Patient Instructions (Signed)
Keep clean and keep dry    

## 2012-09-25 ENCOUNTER — Ambulatory Visit (INDEPENDENT_AMBULATORY_CARE_PROVIDER_SITE_OTHER): Payer: BC Managed Care – PPO | Admitting: Orthopedic Surgery

## 2012-09-25 ENCOUNTER — Encounter: Payer: Self-pay | Admitting: Orthopedic Surgery

## 2012-09-25 VITALS — Ht 69.0 in | Wt 187.0 lb

## 2012-09-25 DIAGNOSIS — IMO0002 Reserved for concepts with insufficient information to code with codable children: Secondary | ICD-10-CM

## 2012-09-25 DIAGNOSIS — S68119A Complete traumatic metacarpophalangeal amputation of unspecified finger, initial encounter: Secondary | ICD-10-CM

## 2012-09-25 NOTE — Progress Notes (Signed)
Patient ID: Frank White, male   DOB: 02/28/1961, 51 y.o.   MRN: [id number] Chief Complaint  Patient presents with  . Follow-up    1 week recheck on left long finger with dressing change.//12.13.13    Fingertip amputation LEFT long finger doing well. Dressing change. He has a 3 x 6 mm area of granulating tissue. Mild stiffness at the DIP joint.  Recommend soaking daily dressing changes daily. Follow up in 4 weeks

## 2012-09-25 NOTE — Patient Instructions (Signed)
Start dressing changes daily: soak for 15 minutes in warm water and drop of dish detergent  Left dry and apply band-aids

## 2012-10-23 ENCOUNTER — Encounter: Payer: Self-pay | Admitting: Orthopedic Surgery

## 2012-10-23 ENCOUNTER — Ambulatory Visit (INDEPENDENT_AMBULATORY_CARE_PROVIDER_SITE_OTHER): Payer: BC Managed Care – PPO | Admitting: Orthopedic Surgery

## 2012-10-23 VITALS — BP 124/80 | Ht 69.0 in | Wt 187.0 lb

## 2012-10-23 DIAGNOSIS — IMO0002 Reserved for concepts with insufficient information to code with codable children: Secondary | ICD-10-CM

## 2012-10-23 DIAGNOSIS — S68119A Complete traumatic metacarpophalangeal amputation of unspecified finger, initial encounter: Secondary | ICD-10-CM

## 2012-10-23 NOTE — Progress Notes (Signed)
Patient ID: Frank White, male   DOB: [birth year], 52 y.o.   MRN: [id number] Chief Complaint  Patient presents with  . Follow-up    recheck left long finger amputation   1. Traumatic amputation of fingertip     The patient had surgery for partial amputation of his left long finger on 09/07/2012  He is doing well his nail is starting to undergo the changes as expected he has full range of motion at his DIP joint he has normal sensation no cold intolerance doing well followup as needed

## 2012-10-23 NOTE — Patient Instructions (Signed)
activities as tolerated 

## 2014-04-27 ENCOUNTER — Emergency Department (HOSPITAL_COMMUNITY): Payer: BC Managed Care – PPO

## 2014-04-27 ENCOUNTER — Encounter (HOSPITAL_COMMUNITY): Payer: Self-pay | Admitting: Emergency Medicine

## 2014-04-27 ENCOUNTER — Emergency Department (HOSPITAL_COMMUNITY)
Admission: EM | Admit: 2014-04-27 | Discharge: 2014-04-27 | Disposition: A | Discharge status: Home / Self Care | Payer: BC Managed Care – PPO | Attending: Emergency Medicine | Admitting: Emergency Medicine

## 2014-04-27 DIAGNOSIS — R05 Cough: Secondary | ICD-10-CM | POA: Insufficient documentation

## 2014-04-27 DIAGNOSIS — R059 Cough, unspecified: Secondary | ICD-10-CM | POA: Insufficient documentation

## 2014-04-27 DIAGNOSIS — I10 Essential (primary) hypertension: Secondary | ICD-10-CM | POA: Insufficient documentation

## 2014-04-27 DIAGNOSIS — R042 Hemoptysis: Secondary | ICD-10-CM

## 2014-04-27 DIAGNOSIS — F17200 Nicotine dependence, unspecified, uncomplicated: Secondary | ICD-10-CM | POA: Insufficient documentation

## 2014-04-27 DIAGNOSIS — Z792 Long term (current) use of antibiotics: Secondary | ICD-10-CM | POA: Insufficient documentation

## 2014-04-27 DIAGNOSIS — Z7982 Long term (current) use of aspirin: Secondary | ICD-10-CM | POA: Insufficient documentation

## 2014-04-27 LAB — COMPREHENSIVE METABOLIC PANEL
ALBUMIN: 3.8 g/dL (ref 3.5–5.2)
ALT: 20 U/L (ref 0–53)
ANION GAP: 10 (ref 5–15)
AST: 20 U/L (ref 0–37)
Alkaline Phosphatase: 54 U/L (ref 39–117)
BILIRUBIN TOTAL: 0.2 mg/dL — AB (ref 0.3–1.2)
BUN: 19 mg/dL (ref 6–23)
CALCIUM: 9.4 mg/dL (ref 8.4–10.5)
CO2: 29 mEq/L (ref 19–32)
CREATININE: 0.93 mg/dL (ref 0.50–1.35)
Chloride: 101 mEq/L (ref 96–112)
GFR calc Af Amer: >90 mL/min (ref >90)
Glucose, Bld: 123 mg/dL — ABNORMAL HIGH (ref 70–99)
Potassium: 3.3 mEq/L — ABNORMAL LOW (ref 3.7–5.3)
Sodium: 140 mEq/L (ref 137–147)
Total Protein: 7.5 g/dL (ref 6.0–8.3)

## 2014-04-27 LAB — CBC WITH DIFFERENTIAL/PLATELET
BASOS ABS: 0.1 10*3/uL (ref 0.0–0.1)
Basophils Relative: 1 % (ref 0–1)
EOS ABS: 0.2 10*3/uL (ref 0.0–0.7)
Eosinophils Relative: 2 % (ref 0–5)
HEMATOCRIT: 39.4 % (ref 39.0–52.0)
Hemoglobin: 13.7 g/dL (ref 13.0–17.0)
LYMPHS ABS: 3.4 10*3/uL (ref 0.7–4.0)
Lymphocytes Relative: 34 % (ref 12–46)
MCH: 30.1 pg (ref 26.0–34.0)
MCHC: 34.8 g/dL (ref 30.0–36.0)
MCV: 86.6 fL (ref 78.0–100.0)
MONO ABS: 0.6 10*3/uL (ref 0.1–1.0)
Monocytes Relative: 6 % (ref 3–12)
NEUTROS ABS: 5.6 10*3/uL (ref 1.7–7.7)
Neutrophils Relative %: 57 % (ref 43–77)
PLATELETS: 210 10*3/uL (ref 150–400)
RBC: 4.55 MIL/uL (ref 4.22–5.81)
RDW: 13.1 % (ref 11.5–15.5)
WBC: 9.9 10*3/uL (ref 4.0–10.5)

## 2014-04-27 LAB — D-DIMER, QUANTITATIVE: D-Dimer, Quant: 0.32 ug/mL-FEU (ref 0.00–0.48)

## 2014-04-27 LAB — PROTIME-INR
INR: 0.93 (ref 0.00–1.49)
PROTHROMBIN TIME: 12.5 s (ref 11.6–15.2)

## 2014-04-27 MED ORDER — AMOXICILLIN 500 MG PO CAPS: 500.0000 mg | ORAL_CAPSULE | Freq: Three times a day (TID) | ORAL | Status: DC

## 2014-04-27 NOTE — ED Notes (Signed)
Pt c/o cough that is mixed with blood, pt describes the blood as bright and dark in color, states "I can feel the blood filling up and then I will cough until it is gone", denies any n/v,

## 2014-04-27 NOTE — ED Provider Notes (Signed)
CSN: [id number]     Arrival date & time 04/27/14  1524 History   First MD Initiated Contact with Patient 04/27/14 1544     Chief Complaint  Patient presents with  . Hemoptysis     (Consider location/radiation/quality/duration/timing/severity/associated sxs/prior Treatment) Patient is a 53 y.o. male presenting with cough. The history is provided by the patient (the pt states he  has coughed up blood a few times.  he does not feel ill).  Cough Cough characteristics:  Productive Sputum characteristics: mucous and blood. Severity:  Moderate Onset quality:  Sudden Timing:  Intermittent Progression:  Waxing and waning Chronicity:  New Context: not animal exposure   Associated symptoms: no chest pain, no eye discharge, no headaches and no rash     Past Medical History  Diagnosis Date  . Hypertension    Past Surgical History  Procedure Laterality Date  . Appendectomy    . Tonsillectomy    . Amputation  09/07/2012    Procedure: AMPUTATION DIGIT;  Surgeon: Carole Civil, MD;  Location: [facility];  Service: Orthopedics;  Laterality: Left;  left long finger - revision of amputation    Family History  Problem Relation Age of Onset  . Diabetes     History  Substance Use Topics  . Smoking status: Current Every Day Smoker -- 1.00 packs/day    Types: Cigarettes  . Smokeless tobacco: Not on file  . Alcohol Use: No    Review of Systems  Constitutional: Negative for appetite change and fatigue.  HENT: Negative for congestion, ear discharge and sinus pressure.   Eyes: Negative for discharge.  Respiratory: Positive for cough.        Coughing up blood  Cardiovascular: Negative for chest pain.  Gastrointestinal: Negative for abdominal pain and diarrhea.  Genitourinary: Negative for frequency and hematuria.  Musculoskeletal: Negative for back pain.  Skin: Negative for rash.  Neurological: Negative for seizures and headaches.  Psychiatric/Behavioral: Negative for hallucinations.       Allergies  Review of patient's allergies indicates no known allergies.  Home Medications   Prior to Admission medications   Medication Sig Start Date End Date Taking? Authorizing Provider  aspirin EC 81 MG tablet Take 81 mg by mouth every morning.   Yes Historical Provider, MD  KRILL OIL 1000 MG CAPS Take 1 capsule by mouth every morning.   Yes Historical Provider, MD  lisinopril-hydrochlorothiazide (PRINZIDE,ZESTORETIC) 20-12.5 MG per tablet Take 2 tablets by mouth every morning.   Yes Historical Provider, MD  Multiple Vitamins-Minerals (MEGA MULTIVITAMIN FOR MEN PO) Take 1 tablet by mouth every morning.   Yes Historical Provider, MD  amoxicillin (AMOXIL) 500 MG capsule Take 1 capsule (500 mg total) by mouth 3 (three) times daily. 04/27/14   Maudry Diego, MD   BP 138/90  Pulse 53  Temp(Src) 97.3 F (36.3 C) (Temporal)  Resp 16  SpO2 95% Physical Exam  Constitutional: He is oriented to person, place, and time. He appears well-developed.  HENT:  Head: Normocephalic.  Eyes: Conjunctivae and EOM are normal. No scleral icterus.  Neck: Neck supple. No thyromegaly present.  Cardiovascular: Normal rate and regular rhythm.  Exam reveals no gallop and no friction rub.   No murmur heard. Pulmonary/Chest: No stridor. He has no wheezes. He has no rales. He exhibits no tenderness.  Abdominal: He exhibits no distension. There is no tenderness. There is no rebound.  Musculoskeletal: Normal range of motion. He exhibits no edema.  Lymphadenopathy:    He has no  cervical adenopathy.  Neurological: He is oriented to person, place, and time. He exhibits normal muscle tone. Coordination normal.  Skin: No rash noted. No erythema.  Psychiatric: He has a normal mood and affect. His behavior is normal.    ED Course  Procedures (including critical care time) Labs Review Labs Reviewed  COMPREHENSIVE METABOLIC PANEL - Abnormal; Notable for the following:    Potassium 3.3 (*)    Glucose, Bld  123 (*)    Total Bilirubin 0.2 (*)    All other components within normal limits  CBC WITH DIFFERENTIAL  PROTIME-INR  D-DIMER, QUANTITATIVE    Imaging Review Dg Chest 2 View  04/27/2014   CLINICAL DATA:  HEMOPTYSIS AND COUGH FOR 1 DAY.  EXAM: CHEST  2 VIEW  COMPARISON:  12/18/2010.  FINDINGS: Cardiopericardial silhouette within normal limits. Mediastinal contours normal. Trachea midline. No airspace disease or effusion.  IMPRESSION: No active cardiopulmonary disease.   Electronically Signed   By: Dereck Ligas M.D.   On: 04/27/2014 16:37     EKG Interpretation None      MDM   Final diagnoses:  Hemoptysis    Will tx for bronchitis and close follow up with pcp    Maudry Diego, MD 04/27/14 1820

## 2014-04-27 NOTE — Discharge Instructions (Signed)
Follow up with your md in 1-2 days.  Return here if symptoms get worse.  Stop the aspirin

## 2014-05-01 ENCOUNTER — Ambulatory Visit (INDEPENDENT_AMBULATORY_CARE_PROVIDER_SITE_OTHER): Payer: BC Managed Care – PPO | Admitting: Otolaryngology

## 2014-05-01 DIAGNOSIS — R070 Pain in throat: Secondary | ICD-10-CM

## 2014-05-01 DIAGNOSIS — J310 Chronic rhinitis: Secondary | ICD-10-CM

## 2014-05-01 DIAGNOSIS — J343 Hypertrophy of nasal turbinates: Secondary | ICD-10-CM

## 2014-05-02 ENCOUNTER — Other Ambulatory Visit (HOSPITAL_COMMUNITY): Payer: Self-pay | Admitting: Family Medicine

## 2014-05-02 ENCOUNTER — Ambulatory Visit (HOSPITAL_COMMUNITY)
Admission: RE | Admit: 2014-05-02 | Discharge: 2014-05-02 | Disposition: A | Discharge status: Home / Self Care | Payer: BC Managed Care – PPO | Source: Ambulatory Visit | Attending: Family Medicine | Admitting: Family Medicine

## 2014-05-02 ENCOUNTER — Encounter (HOSPITAL_COMMUNITY): Payer: Self-pay

## 2014-05-02 DIAGNOSIS — J984 Other disorders of lung: Secondary | ICD-10-CM | POA: Insufficient documentation

## 2014-05-02 DIAGNOSIS — R042 Hemoptysis: Secondary | ICD-10-CM | POA: Insufficient documentation

## 2014-05-02 MED ORDER — IOHEXOL 300 MG/ML  SOLN
80.0000 mL | Freq: Once | INTRAMUSCULAR | Status: AC | PRN
  Administered 2014-05-02: 80 mL via INTRAVENOUS

## 2015-05-11 ENCOUNTER — Institutional Professional Consult (permissible substitution): Payer: Medicare Other | Admitting: Neurology

## 2015-12-26 ENCOUNTER — Emergency Department (HOSPITAL_COMMUNITY)
Admission: EM | Admit: 2015-12-26 | Discharge: 2015-12-26 | Disposition: A | Discharge status: Home / Self Care | Payer: BLUE CROSS/BLUE SHIELD | Attending: Emergency Medicine | Admitting: Emergency Medicine

## 2015-12-26 ENCOUNTER — Encounter (HOSPITAL_COMMUNITY): Payer: Self-pay | Admitting: *Deleted

## 2015-12-26 ENCOUNTER — Emergency Department (HOSPITAL_COMMUNITY): Payer: BLUE CROSS/BLUE SHIELD

## 2015-12-26 DIAGNOSIS — S61312A Laceration without foreign body of right middle finger with damage to nail, initial encounter: Secondary | ICD-10-CM

## 2015-12-26 DIAGNOSIS — F17210 Nicotine dependence, cigarettes, uncomplicated: Secondary | ICD-10-CM | POA: Insufficient documentation

## 2015-12-26 DIAGNOSIS — Y9389 Activity, other specified: Secondary | ICD-10-CM | POA: Diagnosis not present

## 2015-12-26 DIAGNOSIS — I10 Essential (primary) hypertension: Secondary | ICD-10-CM | POA: Insufficient documentation

## 2015-12-26 DIAGNOSIS — Y999 Unspecified external cause status: Secondary | ICD-10-CM | POA: Diagnosis not present

## 2015-12-26 DIAGNOSIS — Y92096 Garden or yard of other non-institutional residence as the place of occurrence of the external cause: Secondary | ICD-10-CM | POA: Insufficient documentation

## 2015-12-26 DIAGNOSIS — W230XXA Caught, crushed, jammed, or pinched between moving objects, initial encounter: Secondary | ICD-10-CM | POA: Diagnosis not present

## 2015-12-26 DIAGNOSIS — S61210A Laceration without foreign body of right index finger without damage to nail, initial encounter: Secondary | ICD-10-CM

## 2015-12-26 DIAGNOSIS — S61212A Laceration without foreign body of right middle finger without damage to nail, initial encounter: Secondary | ICD-10-CM | POA: Diagnosis not present

## 2015-12-26 DIAGNOSIS — S62630A Displaced fracture of distal phalanx of right index finger, initial encounter for closed fracture: Secondary | ICD-10-CM | POA: Diagnosis not present

## 2015-12-26 MED ORDER — LIDOCAINE HCL (PF) 2 % IJ SOLN
INTRAMUSCULAR | Status: AC
  Administered 2015-12-26: 10 mL
  Filled 2015-12-26: qty 10

## 2015-12-26 MED ORDER — SULFAMETHOXAZOLE-TRIMETHOPRIM 800-160 MG PO TABS: 1.0000 | ORAL_TABLET | Freq: Two times a day (BID) | ORAL | Status: AC

## 2015-12-26 MED ORDER — LIDOCAINE HCL (PF) 2 % IJ SOLN
10.0000 mL | Freq: Once | INTRAMUSCULAR | Status: AC
  Administered 2015-12-26: 10 mL via INTRADERMAL
  Filled 2015-12-26: qty 10

## 2015-12-26 NOTE — ED Provider Notes (Signed)
The patient is a 54 year old male who suffered a crush injury to his right third and pointer fingers, this occurred just prior to arrival, on exam the patient has normal neurovascular function to those fingers, there is no other hand injury, the x-rays do show a fracture of the tuft of the second finger. This is an open fracture, tetanus will be updated, the finger will be cleaned and loosely sutured with absorbable sutures at the request of Dr. Grandville Silos who I spoke with on the phone and who will arrange follow-up for the patient. Antibiotics, close follow-up, the patient is in agreement with the plan.  Medical screening examination/treatment/procedure(s) were conducted as a shared visit with non-physician practitioner(s) and myself.  I personally evaluated the patient during the encounter.  Clinical Impression:   Final diagnoses:  Laceration of right index finger w/o foreign body w/o damage to nail, initial encounter  Laceration of right middle finger w/o foreign body with damage to nail, initial encounter         Noemi Chapel, MD 12/28/15 1257

## 2015-12-26 NOTE — Discharge Instructions (Signed)
Laceration Care, Adult  A laceration is a cut that goes through all of the layers of the skin and into the tissue that is right under the skin. Some lacerations heal on their own. Others need to be closed with stitches (sutures), staples, skin adhesive strips, or skin glue. Proper laceration care minimizes the risk of infection and helps the laceration to heal better.  HOW TO CARE FOR YOUR LACERATION  If sutures or staples were used:  · Keep the wound clean and dry.  · If you were given a bandage (dressing), you should change it at least one time per day or as told by your health care provider. You should also change it if it becomes wet or dirty.  · Keep the wound completely dry for the first 24 hours or as told by your health care provider. After that time, you may shower or bathe. However, make sure that the wound is not soaked in water until after the sutures or staples have been removed.  · Clean the wound one time each day or as told by your health care provider:    Wash the wound with soap and water.    Rinse the wound with water to remove all soap.    Pat the wound dry with a clean towel. Do not rub the wound.  · After cleaning the wound, apply a thin layer of antibiotic ointment as told by your health care provider. This will help to prevent infection and keep the dressing from sticking to the wound.  · Have the sutures or staples removed as told by your health care provider.    General Instructions  · Take over-the-counter and prescription medicines only as told by your health care provider.  · If you were prescribed an antibiotic medicine or ointment, take or apply it as told by your doctor. Do not stop using it even if your condition improves.  · To help prevent scarring, make sure to cover your wound with sunscreen whenever you are outside after stitches are removed, after adhesive strips are removed, or when glue remains in place and the wound is healed. Make sure to wear a sunscreen of at least 30  SPF.  · Do not scratch or pick at the wound.  · Keep all follow-up visits as told by your health care provider. This is important.  · Check your wound every day for signs of infection. Watch for:    Redness, swelling, or pain.    Fluid, blood, or pus.  · Raise (elevate) the injured area above the level of your heart while you are sitting or lying down, if possible.  SEEK MEDICAL CARE IF:  · You received a tetanus shot and you have swelling, severe pain, redness, or bleeding at the injection site.  · You have a fever.  · A wound that was closed breaks open.  · You notice a bad smell coming from your wound or your dressing.  · You notice something coming out of the wound, such as wood or glass.  · Your pain is not controlled with medicine.  · You have increased redness, swelling, or pain at the site of your wound.  · You have fluid, blood, or pus coming from your wound.  · You notice a change in the color of your skin near your wound.  · You need to change the dressing frequently due to fluid, blood, or pus draining from the wound.  · You develop a new rash.  ·   You develop numbness around the wound.  SEEK IMMEDIATE MEDICAL CARE IF:  · You develop severe swelling around the wound.  · Your pain suddenly increases and is severe.  · You develop painful lumps near the wound or on skin that is anywhere on your body.  · You have a red streak going away from your wound.  · The wound is on your hand or foot and you cannot properly move a finger or toe.  · The wound is on your hand or foot and you notice that your fingers or toes look pale or bluish.     This information is not intended to replace advice given to you by your health care provider. Make sure you discuss any questions you have with your health care provider.     Document Released: 09/12/2005 Document Revised: 01/27/2015 Document Reviewed: 09/08/2014  [employer]vier Interactive Patient Education ©2016 [employer]

## 2015-12-26 NOTE — ED Notes (Signed)
Pt got his fingers smashed between a piece of wood and a tiller, pt has laceration to right hand middle finger and pointer finger. Pt is able to move fingers.
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
## 2015-12-28 ENCOUNTER — Telehealth: Payer: Self-pay | Admitting: Orthopedic Surgery

## 2015-12-28 NOTE — Telephone Encounter (Signed)
????    If hes having trouble i can see him

## 2015-12-28 NOTE — Telephone Encounter (Signed)
Patient's wife, Bonnie(who works for [employer]) said that they wanted to know if Frank White had to go to a Copy or if you would see him?  Please advise.  Thanks

## 2015-12-30 ENCOUNTER — Ambulatory Visit (INDEPENDENT_AMBULATORY_CARE_PROVIDER_SITE_OTHER): Payer: BLUE CROSS/BLUE SHIELD | Admitting: Orthopedic Surgery

## 2015-12-30 VITALS — BP 150/81 | HR 71 | Ht 68.0 in | Wt 202.0 lb

## 2015-12-30 DIAGNOSIS — S62609A Fracture of unspecified phalanx of unspecified finger, initial encounter for closed fracture: Secondary | ICD-10-CM | POA: Diagnosis not present

## 2015-12-30 DIAGNOSIS — S61210A Laceration without foreign body of right index finger without damage to nail, initial encounter: Secondary | ICD-10-CM | POA: Diagnosis not present

## 2015-12-30 DIAGNOSIS — S61214A Laceration without foreign body of right ring finger without damage to nail, initial encounter: Secondary | ICD-10-CM
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
## 2016-01-25 ENCOUNTER — Ambulatory Visit (INDEPENDENT_AMBULATORY_CARE_PROVIDER_SITE_OTHER): Payer: BLUE CROSS/BLUE SHIELD | Admitting: Orthopedic Surgery

## 2016-01-25 VITALS — BP 169/91 | HR 56 | Ht 68.0 in | Wt 202.0 lb

## 2016-01-25 DIAGNOSIS — S62609A Fracture of unspecified phalanx of unspecified finger, initial encounter for closed fracture: Secondary | ICD-10-CM

## 2016-01-25 DIAGNOSIS — E782 Mixed hyperlipidemia: Secondary | ICD-10-CM | POA: Diagnosis not present

## 2016-01-25 DIAGNOSIS — S61214A Laceration without foreign body of right ring finger without damage to nail, initial encounter: Secondary | ICD-10-CM

## 2016-01-25 DIAGNOSIS — I10 Essential (primary) hypertension: Secondary | ICD-10-CM | POA: Diagnosis not present

## 2016-01-25 DIAGNOSIS — S61210A Laceration without foreign body of right index finger without damage to nail, initial encounter: Secondary | ICD-10-CM

## 2016-01-25 NOTE — Progress Notes (Signed)
Chief Complaint  Patient presents with  . Follow-up    laceration and fracture left index finger, DOI 12/26/15    BP 169/91 mmHg  Pulse 56  Ht 5\' 8"  (1.727 m)  Wt 202 lb (91.627 kg)  BMI 30.72 kg/m2  Status post laceration of the index finger and middle finger or long finger both lacerations healed nicely has some hypersensitivity to the tip of the index finger but no signs of infection he regained full range of motion

## 2016-02-01 DIAGNOSIS — E782 Mixed hyperlipidemia: Secondary | ICD-10-CM | POA: Diagnosis not present

## 2016-02-01 DIAGNOSIS — F902 Attention-deficit hyperactivity disorder, combined type: Secondary | ICD-10-CM | POA: Diagnosis not present

## 2016-02-01 DIAGNOSIS — I10 Essential (primary) hypertension: Secondary | ICD-10-CM | POA: Diagnosis not present

## 2016-03-02 DIAGNOSIS — I10 Essential (primary) hypertension: Secondary | ICD-10-CM | POA: Diagnosis not present

## 2016-03-25 DIAGNOSIS — L02212 Cutaneous abscess of back [any part, except buttock]: Secondary | ICD-10-CM | POA: Diagnosis not present

## 2016-03-28 DIAGNOSIS — L03319 Cellulitis of trunk, unspecified: Secondary | ICD-10-CM | POA: Diagnosis not present

## 2016-08-05 DIAGNOSIS — E782 Mixed hyperlipidemia: Secondary | ICD-10-CM | POA: Diagnosis not present

## 2016-08-13 DIAGNOSIS — Z0000 Encounter for general adult medical examination without abnormal findings: Secondary | ICD-10-CM | POA: Diagnosis not present

## 2016-08-13 DIAGNOSIS — E782 Mixed hyperlipidemia: Secondary | ICD-10-CM | POA: Diagnosis not present

## 2016-08-13 DIAGNOSIS — R41840 Attention and concentration deficit: Secondary | ICD-10-CM | POA: Diagnosis not present

## 2016-08-13 DIAGNOSIS — I10 Essential (primary) hypertension: Secondary | ICD-10-CM | POA: Diagnosis not present

## 2017-01-23 DIAGNOSIS — I10 Essential (primary) hypertension: Secondary | ICD-10-CM | POA: Diagnosis not present

## 2017-01-28 DIAGNOSIS — E782 Mixed hyperlipidemia: Secondary | ICD-10-CM | POA: Diagnosis not present

## 2017-01-28 DIAGNOSIS — R51 Headache: Secondary | ICD-10-CM | POA: Diagnosis not present

## 2017-01-28 DIAGNOSIS — I10 Essential (primary) hypertension: Secondary | ICD-10-CM | POA: Diagnosis not present

## 2017-01-28 DIAGNOSIS — Z720 Tobacco use: Secondary | ICD-10-CM | POA: Diagnosis not present

## 2017-07-29 DIAGNOSIS — E782 Mixed hyperlipidemia: Secondary | ICD-10-CM | POA: Diagnosis not present

## 2017-07-29 DIAGNOSIS — I10 Essential (primary) hypertension: Secondary | ICD-10-CM | POA: Diagnosis not present

## 2017-07-29 DIAGNOSIS — R7301 Impaired fasting glucose: Secondary | ICD-10-CM | POA: Diagnosis not present

## 2017-08-05 DIAGNOSIS — E782 Mixed hyperlipidemia: Secondary | ICD-10-CM | POA: Diagnosis not present

## 2017-08-05 DIAGNOSIS — I10 Essential (primary) hypertension: Secondary | ICD-10-CM | POA: Diagnosis not present

## 2017-08-05 DIAGNOSIS — R7301 Impaired fasting glucose: Secondary | ICD-10-CM | POA: Diagnosis not present

## 2017-08-05 DIAGNOSIS — R51 Headache: Secondary | ICD-10-CM | POA: Diagnosis not present

## 2017-11-20 DIAGNOSIS — J069 Acute upper respiratory infection, unspecified: Secondary | ICD-10-CM | POA: Diagnosis not present

## 2017-12-30 DIAGNOSIS — J069 Acute upper respiratory infection, unspecified: Secondary | ICD-10-CM | POA: Diagnosis not present

## 2017-12-30 DIAGNOSIS — Z6828 Body mass index (BMI) 28.0-28.9, adult: Secondary | ICD-10-CM | POA: Diagnosis not present

## 2018-01-06 DIAGNOSIS — J301 Allergic rhinitis due to pollen: Secondary | ICD-10-CM | POA: Diagnosis not present

## 2018-01-06 DIAGNOSIS — I10 Essential (primary) hypertension: Secondary | ICD-10-CM | POA: Diagnosis not present

## 2018-04-19 DIAGNOSIS — J302 Other seasonal allergic rhinitis: Secondary | ICD-10-CM | POA: Diagnosis not present

## 2018-04-19 DIAGNOSIS — I10 Essential (primary) hypertension: Secondary | ICD-10-CM | POA: Diagnosis not present

## 2018-04-19 DIAGNOSIS — J301 Allergic rhinitis due to pollen: Secondary | ICD-10-CM | POA: Diagnosis not present

## 2018-04-19 DIAGNOSIS — J069 Acute upper respiratory infection, unspecified: Secondary | ICD-10-CM | POA: Diagnosis not present

## 2018-04-23 DIAGNOSIS — R7301 Impaired fasting glucose: Secondary | ICD-10-CM | POA: Diagnosis not present

## 2018-04-23 DIAGNOSIS — F17200 Nicotine dependence, unspecified, uncomplicated: Secondary | ICD-10-CM | POA: Diagnosis not present

## 2018-04-23 DIAGNOSIS — I10 Essential (primary) hypertension: Secondary | ICD-10-CM | POA: Diagnosis not present

## 2018-04-23 DIAGNOSIS — E782 Mixed hyperlipidemia: Secondary | ICD-10-CM | POA: Diagnosis not present

## 2018-05-14 ENCOUNTER — Encounter: Payer: Self-pay | Admitting: Internal Medicine

## 2018-05-14 LAB — COLOGUARD

## 2018-06-05 DIAGNOSIS — Z1211 Encounter for screening for malignant neoplasm of colon: Secondary | ICD-10-CM | POA: Diagnosis not present

## 2018-07-02 ENCOUNTER — Telehealth (INDEPENDENT_AMBULATORY_CARE_PROVIDER_SITE_OTHER): Payer: Self-pay | Admitting: *Deleted

## 2018-07-02 ENCOUNTER — Encounter (INDEPENDENT_AMBULATORY_CARE_PROVIDER_SITE_OTHER): Payer: Self-pay | Admitting: *Deleted

## 2018-07-02 ENCOUNTER — Encounter (INDEPENDENT_AMBULATORY_CARE_PROVIDER_SITE_OTHER): Payer: Self-pay | Admitting: Internal Medicine

## 2018-07-02 ENCOUNTER — Ambulatory Visit (INDEPENDENT_AMBULATORY_CARE_PROVIDER_SITE_OTHER): Payer: BLUE CROSS/BLUE SHIELD | Admitting: Internal Medicine

## 2018-07-02 VITALS — BP 160/90 | HR 60 | Temp 97.4°F | Ht 68.0 in | Wt 181.2 lb

## 2018-07-02 DIAGNOSIS — R195 Other fecal abnormalities: Secondary | ICD-10-CM | POA: Insufficient documentation

## 2018-07-02 MED ORDER — SUPREP BOWEL PREP KIT 17.5-3.13-1.6 GM/177ML PO SOLN: 1.0000 | Freq: Once | ORAL | 0 refills | Status: AC

## 2018-07-02 NOTE — Telephone Encounter (Signed)
Patient need suprep ?

## 2018-07-02 NOTE — Progress Notes (Signed)
   Subjective:    Patient ID: Frank White, male    DOB: 1960/12/03, 57 y.o.   MRN: [id number]  HPI Referred by Dr. Hilma Favors for positive cologuard. Patient has never undergone a colonoscopy in the past. No family hx of colon cancer. No weight loss. His appetite is good. No change in his stool. No melena or BRRB. Denies any abdominal pain.  Patient does not eat meat and he is a [place]. Has 1-2 stools a day and they are firm.     Review of Systems Past Medical History:  Diagnosis Date  . Hypertension     Past Surgical History:  Procedure Laterality Date  . AMPUTATION  09/07/2012   Procedure: AMPUTATION DIGIT;  Surgeon: Carole Civil, MD;  Location: [facility];  Service: Orthopedics;  Laterality: Left;  left long finger - revision of amputation   . APPENDECTOMY    . TONSILLECTOMY      No Known Allergies  Current Outpatient Medications on File Prior to Visit  Medication Sig Dispense Refill  . amLODipine (NORVASC) 2.5 MG tablet Take 2.5 mg by mouth as needed.    Marland Kitchen aspirin EC 81 MG tablet Take 81 mg by mouth as needed.    Marland Kitchen lisinopril-hydrochlorothiazide (PRINZIDE,ZESTORETIC) 20-12.5 MG per tablet Take 2 tablets by mouth every morning.    . vitamin B-12 (CYANOCOBALAMIN) 500 MCG tablet Take 500 mcg by mouth daily.     No current facility-administered medications on file prior to visit.         Objective:   Physical Exam Blood pressure (!) 160/90, pulse 60, temperature (!) 97.4 F (36.3 C), weight 181 lb 3.2 oz (82.2 kg).  Alert and oriented. Skin warm and dry. Oral mucosa is moist.   . Sclera anicteric, conjunctivae is pink. Thyroid not enlarged. No cervical lymphadenopathy. Bilateral wheezes.  Heart regular rate and rhythm.  Abdomen is soft. Bowel sounds are positive. No hepatomegaly. No abdominal masses felt. No tenderness.  No edema to lower extremities.          Assessment & Plan:  Positive cologuard. Colonic neoplasm needs to be ruled out. Colonoscopy.  The risks of  bleeding, perforation and infection were reviewed with patient.

## 2018-08-08 ENCOUNTER — Ambulatory Visit (HOSPITAL_COMMUNITY)
Admission: RE | Admit: 2018-08-08 | Discharge: 2018-08-08 | Disposition: A | Discharge status: Home / Self Care | Payer: BLUE CROSS/BLUE SHIELD | Source: Ambulatory Visit | Attending: Internal Medicine | Admitting: Internal Medicine

## 2018-08-08 ENCOUNTER — Encounter (HOSPITAL_COMMUNITY)
Admission: RE | Disposition: A | Discharge status: Home / Self Care | Payer: Self-pay | Source: Ambulatory Visit | Attending: Internal Medicine

## 2018-08-08 ENCOUNTER — Other Ambulatory Visit: Payer: Self-pay

## 2018-08-08 ENCOUNTER — Encounter (HOSPITAL_COMMUNITY): Payer: Self-pay | Admitting: *Deleted

## 2018-08-08 DIAGNOSIS — D123 Benign neoplasm of transverse colon: Secondary | ICD-10-CM | POA: Diagnosis not present

## 2018-08-08 DIAGNOSIS — I10 Essential (primary) hypertension: Secondary | ICD-10-CM | POA: Insufficient documentation

## 2018-08-08 DIAGNOSIS — F17210 Nicotine dependence, cigarettes, uncomplicated: Secondary | ICD-10-CM | POA: Diagnosis not present

## 2018-08-08 DIAGNOSIS — Z7982 Long term (current) use of aspirin: Secondary | ICD-10-CM | POA: Diagnosis not present

## 2018-08-08 DIAGNOSIS — Z79899 Other long term (current) drug therapy: Secondary | ICD-10-CM | POA: Diagnosis not present

## 2018-08-08 DIAGNOSIS — K644 Residual hemorrhoidal skin tags: Secondary | ICD-10-CM | POA: Insufficient documentation

## 2018-08-08 DIAGNOSIS — R195 Other fecal abnormalities: Secondary | ICD-10-CM | POA: Diagnosis not present

## 2018-08-08 DIAGNOSIS — D125 Benign neoplasm of sigmoid colon: Secondary | ICD-10-CM | POA: Insufficient documentation

## 2018-08-08 HISTORY — PX: COLONOSCOPY: SHX5424

## 2018-08-08 HISTORY — PX: POLYPECTOMY: SHX5525

## 2018-08-08 SURGERY — COLONOSCOPY
Anesthesia: Moderate Sedation

## 2018-08-08 MED ORDER — MEPERIDINE HCL 50 MG/ML IJ SOLN
INTRAMUSCULAR | Status: AC
  Filled 2018-08-08: qty 1

## 2018-08-08 MED ORDER — MIDAZOLAM HCL 5 MG/5ML IJ SOLN
INTRAMUSCULAR | Status: DC | PRN
  Administered 2018-08-08 (×3): 2 mg via INTRAVENOUS
  Administered 2018-08-08: 1 mg via INTRAVENOUS

## 2018-08-08 MED ORDER — MEPERIDINE HCL 50 MG/ML IJ SOLN
INTRAMUSCULAR | Status: DC | PRN
  Administered 2018-08-08 (×2): 25 mg

## 2018-08-08 MED ORDER — SODIUM CHLORIDE 0.9 % IV SOLN
INTRAVENOUS | Status: DC
  Administered 2018-08-08: 1000 mL via INTRAVENOUS

## 2018-08-08 MED ORDER — STERILE WATER FOR IRRIGATION IR SOLN
Status: DC | PRN
  Administered 2018-08-08: 1.5 mL

## 2018-08-08 MED ORDER — MIDAZOLAM HCL 5 MG/5ML IJ SOLN
INTRAMUSCULAR | Status: AC
  Filled 2018-08-08: qty 10

## 2018-08-08 NOTE — H&P (Signed)
Frank White is an 57 y.o. male.   Chief Complaint: Patient is here for colonoscopy HPI: Patient is 57 year old Caucasian male who underwent Cologuard test for screening and it came back positive.  He is therefore undergoing diagnostic colonoscopy.  He denies abdominal pain change in bowel habits or rectal bleeding.  He takes baby aspirin 3 times a week.  He does not take other OTC NSAIDs.  He has good appetite and his weight has been stable. He has never been screened for CRC before. Family history is negative for CRC.  Past Medical History:  Diagnosis Date  . Hypertension     Past Surgical History:  Procedure Laterality Date  . AMPUTATION  09/07/2012   Procedure: AMPUTATION DIGIT;  Surgeon: Carole Civil, MD;  Location: [facility];  Service: Orthopedics;  Laterality: Left;  left long finger - revision of amputation   . APPENDECTOMY    . TONSILLECTOMY      Family History  Problem Relation Age of Onset  . Diabetes Unknown    Social History:  reports that he has been smoking cigarettes. He has been smoking about 1.00 pack per day. He has never used smokeless tobacco. He reports that he does not drink alcohol or use drugs.  Allergies: No Known Allergies  Medications Prior to Admission  Medication Sig Dispense Refill  . amLODipine (NORVASC) 2.5 MG tablet Take 2.5 mg by mouth every Monday, Wednesday, and Friday.     Marland Kitchen aspirin EC 81 MG tablet Take 81 mg by mouth once a week.     . butalbital-acetaminophen-caffeine (FIORICET, ESGIC) 50-325-40 MG tablet Take 1 tablet by mouth 2 (two) times daily as needed for headache.    . cetirizine (ZYRTEC) 10 MG tablet Take 10 mg by mouth daily as needed for allergies.    Marland Kitchen lisinopril-hydrochlorothiazide (PRINZIDE,ZESTORETIC) 20-25 MG tablet Take 1 tablet by mouth daily.    . vitamin B-12 (CYANOCOBALAMIN) 500 MCG tablet Take 500 mcg by mouth daily.    Marland Kitchen acetaminophen (TYLENOL) 325 MG tablet Take 650 mg by mouth daily as needed for moderate pain  or headache.      No results found for this or any previous visit (from the past 48 hour(s)). No results found.  ROS  Blood pressure (!) 123/93, pulse 71, temperature (!) 97.5 F (36.4 C), temperature source Oral, resp. rate 14, height 5\' 8"  (1.727 m), weight 78 kg, SpO2 95 %. Physical Exam  Constitutional: He appears well-developed and well-nourished.  HENT:  Mouth/Throat: Oropharynx is clear and moist.  Eyes: Conjunctivae are normal. No scleral icterus.  Neck: No thyromegaly present.  Cardiovascular: Normal rate, regular rhythm and normal heart sounds.  No murmur heard. Respiratory: Effort normal and breath sounds normal.  GI: Soft. He exhibits no distension and no mass. There is no tenderness.  Musculoskeletal: He exhibits no edema.  Lymphadenopathy:    He has no cervical adenopathy.  Neurological: He is alert.  Skin: Skin is warm and dry.     Assessment/Plan Positive Cologuard test. Diagnostic colonoscopy.  Frank Laser, MD 08/08/2018, 8:32 AM

## 2018-08-08 NOTE — Discharge Instructions (Signed)
No aspirin or NSAIDs for 24 hours. Resume other medications as before. Resume usual diet. No driving for 24 hours. Physician will call with biopsy results.   Colonoscopy, Adult, Care After This sheet gives you information about how to care for yourself after your procedure. Your health care provider may also give you more specific instructions. If you have problems or questions, contact your health care provider.  Dr Laural Golden:  [phone].  After hours and weekends call the hospital and have the GI doctor on call paged, they will call you back. What can I expect after the procedure? After the procedure, it is common to have:  A small amount of blood in your stool for 24 hours after the procedure.  Some gas.  Mild abdominal cramping or bloating.  Follow these instructions at home: General instructions   For the first 24 hours after the procedure: ? Do not drive or use machinery. ? Do not sign important documents. ? Rest often.  Take over-the-counter or prescription medicines only as told by your health care provider.  It is up to you to get the results of your procedure. Ask your health care provider, or the department performing the procedure, when your results will be ready. Relieving cramping and bloating  Try walking around when you have cramps or feel bloated. Eating and drinking  Drink enough fluid to keep your urine clear or pale yellow.  Resume your normal diet as instructed by your health care provider. Avoid heavy or fried foods that are hard to digest.  Avoid drinking alcohol for as long as instructed by your health care provider. Contact a health care provider if:  You have blood in your stool 2-3 days after the procedure. Get help right away if:  You have more than a small spotting of blood in your stool.  You pass large blood clots in your stool.  Your abdomen is swollen.  You have nausea or vomiting.  You have a fever.  You have increasing  abdominal pain that is not relieved with medicine. This information is not intended to replace advice given to you by your health care provider. Make sure you discuss any questions you have with your health care provider. Document Released: 04/26/2004 Document Revised: 06/06/2016 Document Reviewed: 11/24/2015 Elsevier Interactive Patient Education  [employer].

## 2018-08-08 NOTE — Op Note (Signed)
[facility] Patient Name: Frank White Procedure Date: 08/08/2018 8:21 AM MRN: [id number] Date of Birth: Mar 19, 1961 Attending MD: Hildred Laser , MD CSN: [id number] Age: 57 Admit Type: Outpatient Procedure:                Colonoscopy Indications:              Positive Cologuard test Providers:                Hildred Laser, MD, Lurline Del, RN, Randa Spike,                            Technician Referring MD:             Delphina Cahill, MD Medicines:                Meperidine 50 mg IV, Midazolam 7 mg IV Complications:            No immediate complications. Estimated Blood Loss:     Estimated blood loss was minimal. Procedure:                Pre-Anesthesia Assessment:                           - Prior to the procedure, a History and Physical                            was performed, and patient medications and                            allergies were reviewed. The patient's tolerance of                            previous anesthesia was also reviewed. The risks                            and benefits of the procedure and the sedation                            options and risks were discussed with the patient.                            All questions were answered, and informed consent                            was obtained. Prior Anticoagulants: The patient                            last took aspirin 7 days prior to the procedure.                            ASA Grade Assessment: II - A patient with mild                            systemic disease. After reviewing the risks and  benefits, the patient was deemed in satisfactory                            condition to undergo the procedure.                           After obtaining informed consent, the colonoscope                            was passed under direct vision. Throughout the                            procedure, the patient's blood pressure, pulse, and                            oxygen  saturations were monitored continuously. The                            [id number]-[id number] ([id number]) was introduced through the                            anus and advanced to the the terminal ileum, with                            identification of the appendiceal orifice and IC                            valve. The colonoscopy was performed without                            difficulty. The patient tolerated the procedure                            well. The quality of the bowel preparation was                            excellent. The terminal ileum, ileocecal valve,                            appendiceal orifice, and rectum were photographed. Scope In: 8:44:22 AM Scope Out: 9:06:42 AM Scope Withdrawal Time: 0 hours 19 minutes 17 seconds  Total Procedure Duration: 0 hours 22 minutes 20 seconds  Findings:      The perianal and digital rectal examinations were normal.      The terminal ileum appeared normal.      A 5 mm polyp was found in the distal transverse colon. The polyp was       sessile. The polyp was removed with a cold snare. Resection and       retrieval were complete. The pathology specimen was placed into Bottle       Number 1.      Three sessile polyps were found in the sigmoid colon, transverse colon       and distal transverse colon. The polyps were small in size. These were       biopsied with a cold forceps  for histology. The pathology specimen was       placed into Bottle Number 1.      External hemorrhoids were found during retroflexion. The hemorrhoids       were small. Impression:               - The examined portion of the ileum was normal.                           - One 5 mm polyp in the distal transverse colon,                            removed with a cold snare. Resected and retrieved.                           - Three small polyps in the sigmoid colon, in the                            transverse colon and in the distal transverse                            colon.  Biopsied.                           - External hemorrhoids. Moderate Sedation:      Moderate (conscious) sedation was administered by the endoscopy nurse       and supervised by the endoscopist. The following parameters were       monitored: oxygen saturation, heart rate, blood pressure, CO2       capnography and response to care. Total physician intraservice time was       30 minutes. Recommendation:           - Patient has a contact number available for                            emergencies. The signs and symptoms of potential                            delayed complications were discussed with the                            patient. Return to normal activities tomorrow.                            Written discharge instructions were provided to the                            patient.                           - Resume previous diet today.                           - Continue present medications.                           -  No aspirin, ibuprofen, naproxen, or other                            non-steroidal anti-inflammatory drugs for 1 day.                           - Await pathology results.                           - Repeat colonoscopy for surveillance based on                            pathology results. Procedure Code(s):        --- Professional ---                           [phone], Colonoscopy, flexible; with removal of                            tumor(s), polyp(s), or other lesion(s) by snare                            technique                           45380, 59, Colonoscopy, flexible; with biopsy,                            single or multiple                           99153, Moderate sedation; each additional 15                            minutes intraservice time                           G0500, Moderate sedation services provided by the                            same physician or other qualified health care                            professional performing a  gastrointestinal                            endoscopic service that sedation supports,                            requiring the presence of an independent trained                            observer to assist in the monitoring of the                            patient's level of consciousness and physiological  status; initial 15 minutes of intra-service time;                            patient age 45 years or older (additional time may                            be reported with [phone], as appropriate) Diagnosis Code(s):        --- Professional ---                           K64.4, Residual hemorrhoidal skin tags                           D12.3, Benign neoplasm of transverse colon (hepatic                            flexure or splenic flexure)                           D12.5, Benign neoplasm of sigmoid colon                           R19.5, Other fecal abnormalities CPT copyright 2018 [employer]. All rights reserved. The codes documented in this report are preliminary and upon coder review may  be revised to meet current compliance requirements. Hildred Laser, MD Hildred Laser, MD 08/08/2018 9:14:55 AM This report has been signed electronically. Number of Addenda: 0

## 2018-08-14 ENCOUNTER — Encounter (HOSPITAL_COMMUNITY): Payer: Self-pay | Admitting: Internal Medicine

## 2018-12-12 DIAGNOSIS — R05 Cough: Secondary | ICD-10-CM | POA: Diagnosis not present

## 2018-12-12 DIAGNOSIS — R509 Fever, unspecified: Secondary | ICD-10-CM | POA: Diagnosis not present

## 2018-12-12 DIAGNOSIS — R0602 Shortness of breath: Secondary | ICD-10-CM | POA: Diagnosis not present

## 2018-12-14 ENCOUNTER — Emergency Department (HOSPITAL_COMMUNITY): Payer: BLUE CROSS/BLUE SHIELD

## 2018-12-14 ENCOUNTER — Encounter (HOSPITAL_COMMUNITY): Payer: Self-pay | Admitting: Emergency Medicine

## 2018-12-14 ENCOUNTER — Other Ambulatory Visit: Payer: Self-pay

## 2018-12-14 ENCOUNTER — Emergency Department (HOSPITAL_COMMUNITY)
Admission: EM | Admit: 2018-12-14 | Discharge: 2018-12-14 | Disposition: A | Discharge status: Home / Self Care | Payer: BLUE CROSS/BLUE SHIELD | Attending: Emergency Medicine | Admitting: Emergency Medicine

## 2018-12-14 DIAGNOSIS — R509 Fever, unspecified: Secondary | ICD-10-CM | POA: Diagnosis not present

## 2018-12-14 DIAGNOSIS — F17210 Nicotine dependence, cigarettes, uncomplicated: Secondary | ICD-10-CM | POA: Insufficient documentation

## 2018-12-14 DIAGNOSIS — Z79899 Other long term (current) drug therapy: Secondary | ICD-10-CM | POA: Diagnosis not present

## 2018-12-14 DIAGNOSIS — I10 Essential (primary) hypertension: Secondary | ICD-10-CM | POA: Insufficient documentation

## 2018-12-14 DIAGNOSIS — K5900 Constipation, unspecified: Secondary | ICD-10-CM | POA: Diagnosis not present

## 2018-12-14 DIAGNOSIS — R1013 Epigastric pain: Secondary | ICD-10-CM | POA: Diagnosis not present

## 2018-12-14 DIAGNOSIS — R748 Abnormal levels of other serum enzymes: Secondary | ICD-10-CM | POA: Insufficient documentation

## 2018-12-14 DIAGNOSIS — R590 Localized enlarged lymph nodes: Secondary | ICD-10-CM | POA: Diagnosis not present

## 2018-12-14 DIAGNOSIS — D72819 Decreased white blood cell count, unspecified: Secondary | ICD-10-CM | POA: Diagnosis not present

## 2018-12-14 DIAGNOSIS — Z7982 Long term (current) use of aspirin: Secondary | ICD-10-CM | POA: Diagnosis not present

## 2018-12-14 DIAGNOSIS — K429 Umbilical hernia without obstruction or gangrene: Secondary | ICD-10-CM | POA: Diagnosis not present

## 2018-12-14 DIAGNOSIS — R591 Generalized enlarged lymph nodes: Secondary | ICD-10-CM | POA: Diagnosis not present

## 2018-12-14 LAB — URINALYSIS, ROUTINE W REFLEX MICROSCOPIC
BILIRUBIN URINE: NEGATIVE
Bacteria, UA: NONE SEEN
Glucose, UA: NEGATIVE mg/dL
Ketones, ur: NEGATIVE mg/dL
LEUKOCYTE UA: NEGATIVE
NITRITE: NEGATIVE
Protein, ur: NEGATIVE mg/dL
SPECIFIC GRAVITY, URINE: 1.025 (ref 1.005–1.030)
pH: 5 (ref 5.0–8.0)

## 2018-12-14 LAB — COMPREHENSIVE METABOLIC PANEL
ALK PHOS: 101 U/L (ref 38–126)
ALT: 119 U/L — AB (ref 0–44)
AST: 95 U/L — ABNORMAL HIGH (ref 15–41)
Albumin: 3.8 g/dL (ref 3.5–5.0)
Anion gap: 9 (ref 5–15)
BILIRUBIN TOTAL: 0.6 mg/dL (ref 0.3–1.2)
BUN: 15 mg/dL (ref 6–20)
CO2: 27 mmol/L (ref 22–32)
CREATININE: 0.83 mg/dL (ref 0.61–1.24)
Calcium: 8.8 mg/dL — ABNORMAL LOW (ref 8.9–10.3)
Chloride: 96 mmol/L — ABNORMAL LOW (ref 98–111)
Glucose, Bld: 127 mg/dL — ABNORMAL HIGH (ref 70–99)
Potassium: 3.4 mmol/L — ABNORMAL LOW (ref 3.5–5.1)
Sodium: 132 mmol/L — ABNORMAL LOW (ref 135–145)
TOTAL PROTEIN: 7.2 g/dL (ref 6.5–8.1)

## 2018-12-14 LAB — CBC
HCT: 42.7 % (ref 39.0–52.0)
Hemoglobin: 14.4 g/dL (ref 13.0–17.0)
MCH: 29.3 pg (ref 26.0–34.0)
MCHC: 33.7 g/dL (ref 30.0–36.0)
MCV: 86.8 fL (ref 80.0–100.0)
NRBC: 0 % (ref 0.0–0.2)
Platelets: 91 10*3/uL — ABNORMAL LOW (ref 150–400)
RBC: 4.92 MIL/uL (ref 4.22–5.81)
RDW: 12.9 % (ref 11.5–15.5)
WBC: 2.4 10*3/uL — AB (ref 4.0–10.5)

## 2018-12-14 LAB — LIPASE, BLOOD: LIPASE: 32 U/L (ref 11–51)

## 2018-12-14 MED ORDER — MORPHINE SULFATE (PF) 4 MG/ML IV SOLN
4.0000 mg | Freq: Once | INTRAVENOUS | Status: AC
  Administered 2018-12-14: 4 mg via INTRAVENOUS
  Filled 2018-12-14: qty 1

## 2018-12-14 MED ORDER — SODIUM CHLORIDE 0.9 % IV BOLUS
1000.0000 mL | Freq: Once | INTRAVENOUS | Status: AC
  Administered 2018-12-14: 1000 mL via INTRAVENOUS

## 2018-12-14 MED ORDER — ONDANSETRON 4 MG PO TBDP
ORAL_TABLET | ORAL | Status: AC
  Filled 2018-12-14: qty 1

## 2018-12-14 MED ORDER — IOHEXOL 300 MG/ML  SOLN
100.0000 mL | Freq: Once | INTRAMUSCULAR | Status: AC | PRN
  Administered 2018-12-14: 100 mL via INTRAVENOUS

## 2018-12-14 MED ORDER — ONDANSETRON 4 MG PO TBDP
4.0000 mg | ORAL_TABLET | Freq: Once | ORAL | Status: AC | PRN
  Administered 2018-12-14: 4 mg via ORAL

## 2018-12-14 NOTE — Discharge Instructions (Signed)
Your white blood cell count was slightly low.  Liver functions elevated.  Lymph nodes were noted in your abdomen.  You will need a repeat CT scan of your abdomen in 3 to 6 months to assess the lymph nodes.  Recommend MiraLAX and/or fleets enema for constipation.  Follow-up with your primary care doctor

## 2018-12-14 NOTE — ED Triage Notes (Signed)
Pt sent over by Dr. Juel Burrow office for abdominal pain that 2 days. Pt reports nausea and cramping, but no v/d. States pain worsens when he lies flat.

## 2018-12-14 NOTE — ED Notes (Signed)
Patient transported to CT 

## 2018-12-14 NOTE — ED Provider Notes (Signed)
[facility] EMERGENCY DEPARTMENT Provider Note   CSN: [id number] Arrival date & time: 12/14/18  1317    History   Chief Complaint Chief Complaint  Patient presents with  . Abdominal Pain    HPI Frank White is a 58 y.o. male.     Epigastric pain for 2 days with associated nausea, but no vomiting, fever, jaundice, diarrhea.  This is unusual for patient.  No previous abdominal surgery.  No known gallbladder, liver, pancreas pathology.  Review of systems positive for constipation.  He has taken nothing at home.  Severity is moderate.     Past Medical History:  Diagnosis Date  . Hypertension     Patient Active Problem List   Diagnosis Date Noted  . Positive colorectal cancer screening using Cologuard test 07/02/2018  . 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 AMPUTATION  09/07/2012   Procedure: AMPUTATION DIGIT;  Surgeon: Carole Civil, MD;  Location: [facility];  Service: Orthopedics;  Laterality: Left;  left long finger - revision of amputation   . APPENDECTOMY    . COLONOSCOPY N/A 08/08/2018   Procedure: COLONOSCOPY;  Surgeon: Rogene Houston, MD;  Location: [facility];  Service: Endoscopy;  Laterality: N/A;  8:30  . POLYPECTOMY  08/08/2018   Procedure: POLYPECTOMY;  Surgeon: Rogene Houston, MD;  Location: [facility];  Service: Endoscopy;;  colon   . TONSILLECTOMY          Home Medications    Prior to Admission medications   Medication Sig Start Date End Date Taking? Authorizing Provider  acetaminophen (TYLENOL) 325 MG tablet Take 650 mg by mouth daily as needed for moderate pain or headache.    [provider]  amLODipine (NORVASC) 2.5 MG tablet Take 2.5 mg by mouth every Monday, Wednesday, and Friday.     [provider]  aspirin EC 81 MG tablet Take 1 tablet (81 mg total) by mouth once a week. 08/09/18   Rehman, Mechele Dawley, MD  butalbital-acetaminophen-caffeine  (FIORICET, ESGIC) 50-325-40 MG tablet Take 1 tablet by mouth 2 (two) times daily as needed for headache.    [provider]  cetirizine (ZYRTEC) 10 MG tablet Take 10 mg by mouth daily as needed for allergies.    [provider]  lisinopril-hydrochlorothiazide (PRINZIDE,ZESTORETIC) 20-25 MG tablet Take 1 tablet by mouth daily.    [provider]  vitamin B-12 (CYANOCOBALAMIN) 500 MCG tablet Take 500 mcg by mouth daily.    [provider]    Family History Family History  Problem Relation Age of Onset  . Diabetes Other     Social History Social History   Tobacco Use  . Smoking status: Current Every Day Smoker    Packs/day: 1.00    Types: Cigarettes  . Smokeless tobacco: Never Used  Substance Use Topics  . Alcohol use: No  . Drug use: No     Allergies   Patient has no known allergies.   Review of Systems Review of Systems  All other systems reviewed and are negative.    Physical Exam Updated Vital Signs BP 135/79   Pulse 65   Temp 97.8 F (36.6 C) (Oral)   Resp 16   Wt 81.2 kg   SpO2 99%   BMI 27.22 kg/m   Physical Exam Vitals signs and nursing note reviewed.  Constitutional:      Appearance: He is well-developed.  HENT:  Head: Normocephalic and atraumatic.  Eyes:     Conjunctiva/sclera: Conjunctivae normal.  Neck:     Musculoskeletal: Neck supple.  Cardiovascular:     Rate and Rhythm: Normal rate and regular rhythm.  Pulmonary:     Effort: Pulmonary effort is normal.     Breath sounds: Normal breath sounds.  Abdominal:     General: Bowel sounds are normal.     Palpations: Abdomen is soft.     Comments: Tender epigastrium  Musculoskeletal: Normal range of motion.  Skin:    General: Skin is warm and dry.  Neurological:     Mental Status: He is alert and oriented to person, place, and time.  Psychiatric:        Behavior: Behavior normal.      ED Treatments / Results  Labs (all labs ordered are listed,  but only abnormal results are displayed) Labs Reviewed  COMPREHENSIVE METABOLIC PANEL - Abnormal; Notable for the following components:      Result Value   Sodium 132 (*)    Potassium 3.4 (*)    Chloride 96 (*)    Glucose, Bld 127 (*)    Calcium 8.8 (*)    AST 95 (*)    ALT 119 (*)    All other components within normal limits  CBC - Abnormal; Notable for the following components:   WBC 2.4 (*)    Platelets 91 (*)    All other components within normal limits  URINALYSIS, ROUTINE W REFLEX MICROSCOPIC - Abnormal; Notable for the following components:   Color, Urine AMBER (*)    APPearance HAZY (*)    Hgb urine dipstick SMALL (*)    All other components within normal limits  LIPASE, BLOOD    EKG None  Radiology Ct Abdomen Pelvis W Contrast  Result Date: 12/14/2018 CLINICAL DATA:  Per ED notes: Pt sent over by Dr. Juel Burrow office for abdominal pain that 2 days. Pt reports nausea and cramping, but no v/d. States pain worsens when he lies flat. EXAM: CT ABDOMEN AND PELVIS WITH CONTRAST TECHNIQUE: Multidetector CT imaging of the abdomen and pelvis was performed using the standard protocol following bolus administration of intravenous contrast. CONTRAST:  149mL OMNIPAQUE IOHEXOL 300 MG/ML  SOLN COMPARISON:  None. FINDINGS: Lower chest: No acute abnormality. Hepatobiliary: No focal liver abnormality is seen. No radiopaque gallstones, biliary dilatation, or pericholecystic inflammatory changes. Pancreas: Unremarkable. No pancreatic ductal dilatation or surrounding inflammatory changes. Spleen: Normal in size without focal abnormality. Adrenals/Urinary Tract: Adrenal glands are normal in appearance. No renal mass or hydronephrosis. The ureters are unremarkable. The bladder and visualized portion of the urethra are normal. Stomach/Bowel: The stomach and small bowel loops are normal in appearance. Significant stool burden in otherwise normal appearing loops of colon. Appendectomy. Vascular/Lymphatic:  Focal atherosclerotic calcification of the abdominal aorta. No aneurysm. There is normal vascular opacification of the celiac axis, superior mesenteric artery, and inferior mesenteric artery. Normal appearance of the portal venous system and inferior vena cava. There are small lymph nodes within the mesentery, retroperitoneum, and inguinal region. The largest measures 10 millimeters in short axis. Reproductive: Prostate is unremarkable. Other: Small fat containing paraumbilical hernia. No ascites. Musculoskeletal: No acute or significant osseous findings. IMPRESSION: 1. No evidence for acute abnormality. 2. Small, nonspecific mesenteric, retroperitoneal, and inguinal lymph nodes. The findings are favored to be benign. However follow-up CT of the abdomen and pelvis with intravenous and oral contrast is recommended in 3-6 months to assess for stability and exclude lymphoproliferative  disease. 3. Small fat containing paraumbilical hernia. 4. Significant stool burden. 5.  Aortic atherosclerosis.  (ICD10-I70.0) Electronically Signed   By: Nolon Nations M.D.   On: 12/14/2018 17:17    Procedures Procedures (including critical care time)  Medications Ordered in ED Medications  ondansetron (ZOFRAN-ODT) disintegrating tablet 4 mg (4 mg Oral Given 12/14/18 1331)  sodium chloride 0.9 % bolus 1,000 mL (0 mLs Intravenous Stopped 12/14/18 1702)  morphine 4 MG/ML injection 4 mg (4 mg Intravenous Given 12/14/18 1536)  iohexol (OMNIPAQUE) 300 MG/ML solution 100 mL (100 mLs Intravenous Contrast Given 12/14/18 1649)     Initial Impression / Assessment and Plan / ED Course  I have reviewed the triage vital signs and the nursing notes.  Pertinent labs & imaging results that were available during my care of the patient were reviewed by me and considered in my medical decision making (see chart for details).        Patient presents with epigastric pain.  White count suboptimal.  Liver function slightly elevated.  CT  shows abdominal lymphadenopathy.  No acute abdomen.  Patient feels much better after IV fluids and pain management.  I discussed all the findings with the patient and his wife.  He just had MiraLAX and a fleets enema for his constipation.  Will follow-up with his primary care physician  Final Clinical Impressions(s) / ED Diagnoses   Final diagnoses:  Epigastric pain  Elevated liver enzymes  Lymphadenopathy, abdominal  Leukopenia, unspecified type  Constipation, unspecified constipation type    ED Discharge Orders    None       Nat Christen, MD 12/14/18 2018

## 2018-12-17 DIAGNOSIS — R8290 Unspecified abnormal findings in urine: Secondary | ICD-10-CM | POA: Diagnosis not present

## 2018-12-17 DIAGNOSIS — R7301 Impaired fasting glucose: Secondary | ICD-10-CM | POA: Diagnosis not present

## 2018-12-17 DIAGNOSIS — I10 Essential (primary) hypertension: Secondary | ICD-10-CM | POA: Diagnosis not present

## 2018-12-17 DIAGNOSIS — R05 Cough: Secondary | ICD-10-CM | POA: Diagnosis not present

## 2018-12-17 DIAGNOSIS — R1013 Epigastric pain: Secondary | ICD-10-CM | POA: Diagnosis not present

## 2018-12-17 DIAGNOSIS — R0602 Shortness of breath: Secondary | ICD-10-CM | POA: Diagnosis not present

## 2018-12-17 DIAGNOSIS — E782 Mixed hyperlipidemia: Secondary | ICD-10-CM | POA: Diagnosis not present

## 2018-12-17 DIAGNOSIS — K5900 Constipation, unspecified: Secondary | ICD-10-CM | POA: Diagnosis not present

## 2018-12-17 DIAGNOSIS — D72819 Decreased white blood cell count, unspecified: Secondary | ICD-10-CM | POA: Diagnosis not present

## 2018-12-17 DIAGNOSIS — R945 Abnormal results of liver function studies: Secondary | ICD-10-CM | POA: Diagnosis not present

## 2018-12-19 ENCOUNTER — Encounter (INDEPENDENT_AMBULATORY_CARE_PROVIDER_SITE_OTHER): Payer: Self-pay | Admitting: Internal Medicine

## 2018-12-19 ENCOUNTER — Ambulatory Visit (INDEPENDENT_AMBULATORY_CARE_PROVIDER_SITE_OTHER): Payer: BLUE CROSS/BLUE SHIELD | Admitting: Internal Medicine

## 2018-12-19 ENCOUNTER — Other Ambulatory Visit: Payer: Self-pay

## 2018-12-19 VITALS — BP 144/84 | HR 68 | Temp 98.1°F | Ht 68.0 in | Wt 180.7 lb

## 2018-12-19 DIAGNOSIS — B349 Viral infection, unspecified: Secondary | ICD-10-CM | POA: Diagnosis not present

## 2018-12-19 DIAGNOSIS — R748 Abnormal levels of other serum enzymes: Secondary | ICD-10-CM

## 2018-12-19 LAB — COMPREHENSIVE METABOLIC PANEL
AG Ratio: 1.3 (calc) (ref 1.0–2.5)
ALKALINE PHOSPHATASE (APISO): 98 U/L (ref 35–144)
ALT: 165 U/L — ABNORMAL HIGH (ref 9–46)
AST: 84 U/L — AB (ref 10–35)
Albumin: 4 g/dL (ref 3.6–5.1)
BUN: 9 mg/dL (ref 7–25)
CHLORIDE: 100 mmol/L (ref 98–110)
CO2: 32 mmol/L (ref 20–32)
CREATININE: 0.78 mg/dL (ref 0.70–1.33)
Calcium: 9.7 mg/dL (ref 8.6–10.3)
GLOBULIN: 3.1 g/dL (ref 1.9–3.7)
Glucose, Bld: 113 mg/dL (ref 65–139)
Potassium: 4 mmol/L (ref 3.5–5.3)
Sodium: 138 mmol/L (ref 135–146)
Total Bilirubin: 0.4 mg/dL (ref 0.2–1.2)
Total Protein: 7.1 g/dL (ref 6.1–8.1)

## 2018-12-19 NOTE — Patient Instructions (Addendum)
CMET today. Further recommendations to follow.  Bland diet for a few more days.

## 2018-12-19 NOTE — Progress Notes (Signed)
Subjective:    Patient ID: Frank White, male    DOB: May 16, 1961, 58 y.o.   MRN: [id number]  HPI Referred by Dr. Nevada Crane for elevated liver enzymes. Seen in the ED 12/14/2018 with epigastric pain. CT scan abdomen/pelvis w CM: IMPRESSION: 1. No evidence for acute abnormality. 2. Small, nonspecific mesenteric, retroperitoneal, and inguinal lymph nodes. The findings are favored to be benign. However follow-up CT of the abdomen and pelvis with intravenous and oral contrast is recommended in 3-6 months to assess for stability and exclude lymphoproliferative disease. 3. Small fat containing paraumbilical hernia. 4. Significant stool burden. 5.  Aortic atherosclerosis.  (ICD10-I70.0) He tells me he feels better. He was constipated. He did have a BM. Stools are watery now. Nothing solid. He is drinking a lot of water and fluids to have a BM. Has been taking Miralax for his constipation. Used Mag. Citrate yesterday.  Does not drink etoh. His last good BM was a little over a week ago. He says he did have a fever when he was in the ED. No fever since then. Seen at Dr. Juel Burrow office this past Monday and states his liver enzymes were significantly elevated (I will called Dr. Juel Burrow office and obtain them).   12/17/2018 ALP 117, AST 175, ALT 201, Acute Hepatitis panel negative.    Hepatic Function Latest Ref Rng & Units 12/14/2018 04/27/2014 12/18/2010  Total Protein 6.5 - 8.1 g/dL 7.2 7.5 6.9  Albumin 3.5 - 5.0 g/dL 3.8 3.8 3.8  AST 15 - 41 U/L 95(H) 20 22  ALT 0 - 44 U/L 119(H) 20 25  Alk Phosphatase 38 - 126 U/L 101 54 52  Total Bilirubin 0.3 - 1.2 mg/dL 0.6 0.2(L) 0.6  Bilirubin, Direct 0.0 - 0.3 mg/dL - - 0.1       Review of Systems Past Medical History:  Diagnosis Date  . Hypertension     Past Surgical History:  Procedure Laterality Date  . AMPUTATION  09/07/2012   Procedure: AMPUTATION DIGIT;  Surgeon: Carole Civil, MD;  Location: [facility];  Service: Orthopedics;  Laterality:  Left;  left long finger - revision of amputation   . APPENDECTOMY    . COLONOSCOPY N/A 08/08/2018   Procedure: COLONOSCOPY;  Surgeon: Rogene Houston, MD;  Location: [facility];  Service: Endoscopy;  Laterality: N/A;  8:30  . POLYPECTOMY  08/08/2018   Procedure: POLYPECTOMY;  Surgeon: Rogene Houston, MD;  Location: [facility];  Service: Endoscopy;;  colon   . TONSILLECTOMY      No Known Allergies  Current Outpatient Medications on File Prior to Visit  Medication Sig Dispense Refill  . acetaminophen (TYLENOL) 325 MG tablet Take 650 mg by mouth daily as needed for moderate pain or headache.    Marland Kitchen amLODipine (NORVASC) 2.5 MG tablet Take 2.5 mg by mouth once a week.     . butalbital-acetaminophen-caffeine (FIORICET, ESGIC) 50-325-40 MG tablet Take 1 tablet by mouth 2 (two) times daily as needed for headache.    . lisinopril-hydrochlorothiazide (PRINZIDE,ZESTORETIC) 20-25 MG tablet Take 1 tablet by mouth daily.    . vitamin B-12 (CYANOCOBALAMIN) 500 MCG tablet Take 500 mcg by mouth daily.     No current facility-administered medications on file prior to visit.         Objective:   Physical Exam Blood pressure (!) 144/84, pulse 68, temperature 98.1 F (36.7 C), height '5\' 8"'$  (1.727 m), weight 180 lb 11.2 oz (82 kg).  Alert and oriented. Skin  warm and dry. Oral mucosa is moist.   . Sclera anicteric, conjunctivae is pink. Thyroid not enlarged. No cervical lymphadenopathy. Lungs clear. Heart regular rate and rhythm.  Abdomen is soft. Bowel sounds are positive. No hepatomegaly. No abdominal masses felt. Epigastric tenderness.  No edema to lower extremities. Rectal: no stool.         Assessment & Plan:  Elevated liver enzymes. Will repeat today. If elevated, will do further work up.  May need an [place] RUQ. ? Had a viral syndrome. Will get lab work from Dr. Nevada Crane.

## 2018-12-20 ENCOUNTER — Telehealth (INDEPENDENT_AMBULATORY_CARE_PROVIDER_SITE_OTHER): Payer: Self-pay | Admitting: Internal Medicine

## 2018-12-20 DIAGNOSIS — R748 Abnormal levels of other serum enzymes: Secondary | ICD-10-CM

## 2018-12-20 NOTE — Telephone Encounter (Signed)
noted 

## 2018-12-20 NOTE — Telephone Encounter (Signed)
Ann, [place] RUQ. I put it in stat

## 2018-12-20 NOTE — Telephone Encounter (Signed)
Patient sch'd 12/21/18 at [phone] ([phone]), patient aware Radiology will call you with report

## 2018-12-21 ENCOUNTER — Other Ambulatory Visit: Payer: Self-pay

## 2018-12-21 ENCOUNTER — Ambulatory Visit (HOSPITAL_COMMUNITY)
Admission: RE | Admit: 2018-12-21 | Discharge: 2018-12-21 | Disposition: A | Discharge status: Home / Self Care | Payer: BLUE CROSS/BLUE SHIELD | Source: Ambulatory Visit | Attending: Internal Medicine | Admitting: Internal Medicine

## 2018-12-21 DIAGNOSIS — R748 Abnormal levels of other serum enzymes: Secondary | ICD-10-CM

## 2018-12-21 DIAGNOSIS — K7689 Other specified diseases of liver: Secondary | ICD-10-CM | POA: Diagnosis not present

## 2018-12-24 ENCOUNTER — Telehealth (INDEPENDENT_AMBULATORY_CARE_PROVIDER_SITE_OTHER): Payer: Self-pay | Admitting: Internal Medicine

## 2018-12-24 DIAGNOSIS — R748 Abnormal levels of other serum enzymes: Secondary | ICD-10-CM

## 2018-12-24 DIAGNOSIS — R945 Abnormal results of liver function studies: Secondary | ICD-10-CM | POA: Diagnosis not present

## 2018-12-24 DIAGNOSIS — E782 Mixed hyperlipidemia: Secondary | ICD-10-CM | POA: Diagnosis not present

## 2018-12-24 DIAGNOSIS — I10 Essential (primary) hypertension: Secondary | ICD-10-CM | POA: Diagnosis not present

## 2018-12-24 DIAGNOSIS — F17210 Nicotine dependence, cigarettes, uncomplicated: Secondary | ICD-10-CM | POA: Diagnosis not present

## 2018-12-24 NOTE — Telephone Encounter (Signed)
Hepatic ordered 

## 2018-12-24 NOTE — Telephone Encounter (Signed)
Results given to patient.  Hepatic ordered. He feels better

## 2018-12-24 NOTE — Telephone Encounter (Signed)
Patient called regarding test results  -  Ph# [phone]

## 2018-12-25 DIAGNOSIS — R748 Abnormal levels of other serum enzymes: Secondary | ICD-10-CM | POA: Diagnosis not present

## 2018-12-26 ENCOUNTER — Other Ambulatory Visit (INDEPENDENT_AMBULATORY_CARE_PROVIDER_SITE_OTHER): Payer: Self-pay | Admitting: *Deleted

## 2018-12-26 ENCOUNTER — Encounter (INDEPENDENT_AMBULATORY_CARE_PROVIDER_SITE_OTHER): Payer: Self-pay | Admitting: *Deleted

## 2018-12-26 DIAGNOSIS — R748 Abnormal levels of other serum enzymes: Secondary | ICD-10-CM

## 2018-12-26 LAB — HEPATIC FUNCTION PANEL
AG RATIO: 1.2 (calc) (ref 1.0–2.5)
ALKALINE PHOSPHATASE (APISO): 76 U/L (ref 35–144)
ALT: 38 U/L (ref 9–46)
AST: 18 U/L (ref 10–35)
Albumin: 3.8 g/dL (ref 3.6–5.1)
BILIRUBIN DIRECT: 0.1 mg/dL (ref 0.0–0.2)
BILIRUBIN INDIRECT: 0.3 mg/dL (ref 0.2–1.2)
Globulin: 3.3 g/dL (calc) (ref 1.9–3.7)
Total Bilirubin: 0.4 mg/dL (ref 0.2–1.2)
Total Protein: 7.1 g/dL (ref 6.1–8.1)

## 2018-12-31 DIAGNOSIS — I10 Essential (primary) hypertension: Secondary | ICD-10-CM | POA: Diagnosis not present

## 2018-12-31 DIAGNOSIS — R945 Abnormal results of liver function studies: Secondary | ICD-10-CM | POA: Diagnosis not present

## 2018-12-31 DIAGNOSIS — D72819 Decreased white blood cell count, unspecified: Secondary | ICD-10-CM | POA: Diagnosis not present

## 2018-12-31 DIAGNOSIS — E782 Mixed hyperlipidemia: Secondary | ICD-10-CM | POA: Diagnosis not present

## 2019-05-06 DIAGNOSIS — I10 Essential (primary) hypertension: Secondary | ICD-10-CM | POA: Diagnosis not present

## 2019-05-06 DIAGNOSIS — R7301 Impaired fasting glucose: Secondary | ICD-10-CM | POA: Diagnosis not present

## 2019-05-06 DIAGNOSIS — E785 Hyperlipidemia, unspecified: Secondary | ICD-10-CM | POA: Diagnosis not present

## 2019-05-06 DIAGNOSIS — E782 Mixed hyperlipidemia: Secondary | ICD-10-CM | POA: Diagnosis not present

## 2019-05-13 DIAGNOSIS — E782 Mixed hyperlipidemia: Secondary | ICD-10-CM | POA: Diagnosis not present

## 2019-05-13 DIAGNOSIS — Z0001 Encounter for general adult medical examination with abnormal findings: Secondary | ICD-10-CM | POA: Diagnosis not present

## 2019-05-13 DIAGNOSIS — I10 Essential (primary) hypertension: Secondary | ICD-10-CM | POA: Diagnosis not present

## 2019-05-13 DIAGNOSIS — R945 Abnormal results of liver function studies: Secondary | ICD-10-CM | POA: Diagnosis not present

## 2019-08-27 DIAGNOSIS — Z20828 Contact with and (suspected) exposure to other viral communicable diseases: Secondary | ICD-10-CM | POA: Diagnosis not present

## 2019-11-22 DIAGNOSIS — I10 Essential (primary) hypertension: Secondary | ICD-10-CM | POA: Diagnosis not present

## 2019-11-22 DIAGNOSIS — R7301 Impaired fasting glucose: Secondary | ICD-10-CM | POA: Diagnosis not present

## 2019-11-22 DIAGNOSIS — E785 Hyperlipidemia, unspecified: Secondary | ICD-10-CM | POA: Diagnosis not present

## 2019-11-22 DIAGNOSIS — R0602 Shortness of breath: Secondary | ICD-10-CM | POA: Diagnosis not present

## 2019-11-22 DIAGNOSIS — R05 Cough: Secondary | ICD-10-CM | POA: Diagnosis not present

## 2019-11-22 DIAGNOSIS — E663 Overweight: Secondary | ICD-10-CM | POA: Diagnosis not present

## 2019-11-22 DIAGNOSIS — E782 Mixed hyperlipidemia: Secondary | ICD-10-CM | POA: Diagnosis not present

## 2019-12-02 DIAGNOSIS — R7303 Prediabetes: Secondary | ICD-10-CM | POA: Diagnosis not present

## 2019-12-02 DIAGNOSIS — E782 Mixed hyperlipidemia: Secondary | ICD-10-CM | POA: Diagnosis not present

## 2019-12-02 DIAGNOSIS — I10 Essential (primary) hypertension: Secondary | ICD-10-CM | POA: Diagnosis not present

## 2019-12-02 DIAGNOSIS — R945 Abnormal results of liver function studies: Secondary | ICD-10-CM | POA: Diagnosis not present

## 2019-12-03 DIAGNOSIS — Z20828 Contact with and (suspected) exposure to other viral communicable diseases: Secondary | ICD-10-CM | POA: Diagnosis not present

## 2019-12-19 DIAGNOSIS — Z20828 Contact with and (suspected) exposure to other viral communicable diseases: Secondary | ICD-10-CM | POA: Diagnosis not present

## 2019-12-24 DIAGNOSIS — Z20828 Contact with and (suspected) exposure to other viral communicable diseases: Secondary | ICD-10-CM | POA: Diagnosis not present

## 2019-12-31 DIAGNOSIS — Z20828 Contact with and (suspected) exposure to other viral communicable diseases: Secondary | ICD-10-CM | POA: Diagnosis not present

## 2020-01-07 DIAGNOSIS — Z20828 Contact with and (suspected) exposure to other viral communicable diseases: Secondary | ICD-10-CM | POA: Diagnosis not present

## 2020-01-14 DIAGNOSIS — Z20828 Contact with and (suspected) exposure to other viral communicable diseases: Secondary | ICD-10-CM | POA: Diagnosis not present

## 2020-01-21 DIAGNOSIS — Z20828 Contact with and (suspected) exposure to other viral communicable diseases: Secondary | ICD-10-CM | POA: Diagnosis not present

## 2020-02-14 DIAGNOSIS — Z20828 Contact with and (suspected) exposure to other viral communicable diseases: Secondary | ICD-10-CM | POA: Diagnosis not present

## 2020-02-21 DIAGNOSIS — U071 COVID-19: Secondary | ICD-10-CM | POA: Diagnosis not present

## 2020-03-13 DIAGNOSIS — Z20828 Contact with and (suspected) exposure to other viral communicable diseases: Secondary | ICD-10-CM | POA: Diagnosis not present

## 2020-03-20 DIAGNOSIS — Z20828 Contact with and (suspected) exposure to other viral communicable diseases: Secondary | ICD-10-CM | POA: Diagnosis not present

## 2020-05-01 DIAGNOSIS — Z20828 Contact with and (suspected) exposure to other viral communicable diseases: Secondary | ICD-10-CM | POA: Diagnosis not present

## 2020-05-01 DIAGNOSIS — R05 Cough: Secondary | ICD-10-CM | POA: Diagnosis not present

## 2020-05-02 DIAGNOSIS — R05 Cough: Secondary | ICD-10-CM | POA: Diagnosis not present

## 2020-05-02 DIAGNOSIS — Z712 Person consulting for explanation of examination or test findings: Secondary | ICD-10-CM | POA: Diagnosis not present

## 2020-05-02 DIAGNOSIS — Z20828 Contact with and (suspected) exposure to other viral communicable diseases: Secondary | ICD-10-CM | POA: Diagnosis not present

## 2020-05-08 DIAGNOSIS — Z20828 Contact with and (suspected) exposure to other viral communicable diseases: Secondary | ICD-10-CM | POA: Diagnosis not present

## 2020-05-08 DIAGNOSIS — R05 Cough: Secondary | ICD-10-CM | POA: Diagnosis not present

## 2020-05-09 DIAGNOSIS — Z20828 Contact with and (suspected) exposure to other viral communicable diseases: Secondary | ICD-10-CM | POA: Diagnosis not present

## 2020-05-09 DIAGNOSIS — Z712 Person consulting for explanation of examination or test findings: Secondary | ICD-10-CM | POA: Diagnosis not present

## 2020-05-09 DIAGNOSIS — R05 Cough: Secondary | ICD-10-CM | POA: Diagnosis not present

## 2020-05-22 DIAGNOSIS — R05 Cough: Secondary | ICD-10-CM | POA: Diagnosis not present

## 2020-05-22 DIAGNOSIS — Z20828 Contact with and (suspected) exposure to other viral communicable diseases: Secondary | ICD-10-CM | POA: Diagnosis not present

## 2020-06-05 DIAGNOSIS — Z20828 Contact with and (suspected) exposure to other viral communicable diseases: Secondary | ICD-10-CM | POA: Diagnosis not present

## 2020-06-05 DIAGNOSIS — R05 Cough: Secondary | ICD-10-CM | POA: Diagnosis not present

## 2020-06-15 DIAGNOSIS — E669 Obesity, unspecified: Secondary | ICD-10-CM | POA: Diagnosis not present

## 2020-06-15 DIAGNOSIS — O99210 Obesity complicating pregnancy, unspecified trimester: Secondary | ICD-10-CM | POA: Diagnosis not present

## 2020-06-15 DIAGNOSIS — E663 Overweight: Secondary | ICD-10-CM | POA: Diagnosis not present

## 2020-06-15 DIAGNOSIS — R7301 Impaired fasting glucose: Secondary | ICD-10-CM | POA: Diagnosis not present

## 2020-06-15 DIAGNOSIS — E782 Mixed hyperlipidemia: Secondary | ICD-10-CM | POA: Diagnosis not present

## 2020-06-15 DIAGNOSIS — R05 Cough: Secondary | ICD-10-CM | POA: Diagnosis not present

## 2020-06-15 DIAGNOSIS — R0602 Shortness of breath: Secondary | ICD-10-CM | POA: Diagnosis not present

## 2020-06-26 DIAGNOSIS — E782 Mixed hyperlipidemia: Secondary | ICD-10-CM | POA: Diagnosis not present

## 2020-06-26 DIAGNOSIS — I10 Essential (primary) hypertension: Secondary | ICD-10-CM | POA: Diagnosis not present

## 2020-06-26 DIAGNOSIS — R7303 Prediabetes: Secondary | ICD-10-CM | POA: Diagnosis not present

## 2020-06-26 DIAGNOSIS — R7301 Impaired fasting glucose: Secondary | ICD-10-CM | POA: Diagnosis not present

## 2020-07-13 DIAGNOSIS — H60392 Other infective otitis externa, left ear: Secondary | ICD-10-CM | POA: Diagnosis not present

## 2020-11-02 DIAGNOSIS — H2513 Age-related nuclear cataract, bilateral: Secondary | ICD-10-CM | POA: Diagnosis not present

## 2020-11-02 DIAGNOSIS — H35373 Puckering of macula, bilateral: Secondary | ICD-10-CM | POA: Diagnosis not present

## 2020-11-02 DIAGNOSIS — H469 Unspecified optic neuritis: Secondary | ICD-10-CM | POA: Diagnosis not present

## 2020-12-17 ENCOUNTER — Telehealth: Payer: Self-pay | Admitting: Orthopedic Surgery
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
## 2020-12-17 NOTE — Telephone Encounter (Signed)
I think we should defer to Dr Aline Brochure to advise.   Dr Aline Brochure can you please review chart and advise?  Thanks-

## 2020-12-18 ENCOUNTER — Emergency Department (HOSPITAL_COMMUNITY)
Admission: EM | Admit: 2020-12-18 | Discharge: 2020-12-18 | Disposition: A | Discharge status: Home / Self Care | Payer: Medicare Other | Attending: Emergency Medicine | Admitting: Emergency Medicine

## 2020-12-18 ENCOUNTER — Emergency Department (HOSPITAL_COMMUNITY): Payer: Medicare Other

## 2020-12-18 ENCOUNTER — Other Ambulatory Visit: Payer: Self-pay

## 2020-12-18 ENCOUNTER — Encounter (HOSPITAL_COMMUNITY): Payer: Self-pay | Admitting: *Deleted

## 2020-12-18 DIAGNOSIS — M7989 Other specified soft tissue disorders: Secondary | ICD-10-CM | POA: Diagnosis not present

## 2020-12-18 DIAGNOSIS — M79644 Pain in right finger(s): Secondary | ICD-10-CM | POA: Diagnosis not present

## 2020-12-18 DIAGNOSIS — I10 Essential (primary) hypertension: Secondary | ICD-10-CM | POA: Diagnosis not present

## 2020-12-18 DIAGNOSIS — Z79899 Other long term (current) drug therapy: Secondary | ICD-10-CM | POA: Insufficient documentation

## 2020-12-18 DIAGNOSIS — M795 Residual foreign body in soft tissue: Secondary | ICD-10-CM | POA: Diagnosis not present

## 2020-12-18 DIAGNOSIS — F17210 Nicotine dependence, cigarettes, uncomplicated: Secondary | ICD-10-CM | POA: Diagnosis not present

## 2020-12-18 DIAGNOSIS — S60452A Superficial foreign body of right middle finger, initial encounter: Secondary | ICD-10-CM | POA: Diagnosis not present

## 2020-12-18 MED ORDER — CEPHALEXIN 500 MG PO CAPS: 500.0000 mg | ORAL_CAPSULE | Freq: Four times a day (QID) | ORAL | 0 refills | Status: AC
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
## 2020-12-18 NOTE — Telephone Encounter (Signed)
Ok to schedule with Dr Aline Brochure to discuss.  Please call and schedule? Thanks

## 2020-12-18 NOTE — Telephone Encounter (Signed)
See him for ??? Lacs or BB  I  can take the BB out
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
## 2020-12-18 NOTE — Discharge Instructions (Signed)
You can take Tylenol or Ibuprofen as directed for pain. You can alternate Tylenol and Ibuprofen every 4 hours. If you take Tylenol at 1pm, then you can take Ibuprofen at 5pm. Then you can take Tylenol again at 9pm.   Take antibiotics as directed. Please take all of your antibiotics until finished.  Follow up with Dr. Aline Brochure as directed.   Return to emergency department for any worsening pain, nerves or swelling that begins to spread, fevers, numbness/weakness or any other worsening concerning symptoms.

## 2020-12-23 ENCOUNTER — Ambulatory Visit: Payer: Medicare Other | Admitting: Orthopedic Surgery

## 2020-12-23 ENCOUNTER — Other Ambulatory Visit: Payer: Self-pay

## 2020-12-23 ENCOUNTER — Encounter: Payer: Self-pay | Admitting: Orthopedic Surgery

## 2020-12-23 ENCOUNTER — Ambulatory Visit (HOSPITAL_COMMUNITY)
Admission: RE | Admit: 2020-12-23 | Discharge: 2020-12-23 | Disposition: A | Discharge status: Home / Self Care | Payer: Medicare Other | Source: Ambulatory Visit | Attending: Orthopedic Surgery | Admitting: Orthopedic Surgery

## 2020-12-23 ENCOUNTER — Telehealth: Payer: Self-pay | Admitting: Radiology

## 2020-12-23 VITALS — BP 181/98 | HR 67 | Ht 68.0 in | Wt 198.0 lb

## 2020-12-23 DIAGNOSIS — M7989 Other specified soft tissue disorders: Secondary | ICD-10-CM

## 2020-12-23 DIAGNOSIS — S60452A Superficial foreign body of right middle finger, initial encounter: Secondary | ICD-10-CM | POA: Diagnosis not present

## 2020-12-23 LAB — CBC WITH DIFFERENTIAL/PLATELET
Eosinophils Relative: 1.7 %
Lymphs Abs: 2787 cells/uL (ref 850–3900)
RDW: 12.2 % (ref 11.0–15.0)
Total Lymphocyte: 26.8 %

## 2020-12-23 MED ORDER — HYDROCODONE-ACETAMINOPHEN 7.5-325 MG PO TABS: 1.0000 | ORAL_TABLET | Freq: Four times a day (QID) | ORAL | 0 refills | Status: DC | PRN

## 2020-12-23 NOTE — Telephone Encounter (Signed)
I worked him in for Tomorrow 1140 to review CT and Labs Will you put in system ?

## 2020-12-23 NOTE — Addendum Note (Signed)
Addended byCandice Camp on: 12/23/2020 11:23 AM   Modules accepted: Orders

## 2020-12-23 NOTE — Patient Instructions (Signed)
Stop taking keflex  CT scan ordered   Star norco for pain
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
## 2020-12-24 ENCOUNTER — Encounter: Payer: Self-pay | Admitting: Orthopedic Surgery

## 2020-12-24 ENCOUNTER — Ambulatory Visit: Payer: BLUE CROSS/BLUE SHIELD | Admitting: Orthopedic Surgery

## 2020-12-24 ENCOUNTER — Ambulatory Visit: Payer: Medicare Other | Admitting: Orthopedic Surgery

## 2020-12-24 VITALS — Ht 68.0 in | Wt 198.0 lb

## 2020-12-24 DIAGNOSIS — M7989 Other specified soft tissue disorders: Secondary | ICD-10-CM | POA: Diagnosis not present

## 2020-12-24 DIAGNOSIS — S60452A Superficial foreign body of right middle finger, initial encounter: Secondary | ICD-10-CM | POA: Diagnosis not present

## 2020-12-24 LAB — CBC WITH DIFFERENTIAL/PLATELET
Absolute Monocytes: 1186 cells/uL — ABNORMAL HIGH (ref 200–950)
Basophils Absolute: 83 cells/uL (ref 0–200)
Basophils Relative: 0.8 %
Eosinophils Absolute: 177 cells/uL (ref 15–500)
HCT: 44.2 % (ref 38.5–50.0)
Hemoglobin: 15 g/dL (ref 13.2–17.1)
MCH: 29.3 pg (ref 27.0–33.0)
MCHC: 33.9 g/dL (ref 32.0–36.0)
MCV: 86.3 fL (ref 80.0–100.0)
MPV: 11.4 fL (ref 7.5–12.5)
Monocytes Relative: 11.4 %
Neutro Abs: 6167 cells/uL (ref 1500–7800)
Neutrophils Relative %: 59.3 %
Platelets: 252 10*3/uL (ref 140–400)
RBC: 5.12 10*6/uL (ref 4.20–5.80)
WBC: 10.4 10*3/uL (ref 3.8–10.8)

## 2020-12-24 LAB — C-REACTIVE PROTEIN: CRP: 20 mg/L — ABNORMAL HIGH (ref <8.0)

## 2020-12-24 LAB — SEDIMENTATION RATE: Sed Rate: 9 mm/h (ref 0–20)

## 2020-12-24 MED ORDER — PROMETHAZINE HCL 12.5 MG PO TABS: 12.5000 mg | ORAL_TABLET | Freq: Four times a day (QID) | ORAL | 0 refills | Status: DC | PRN

## 2020-12-24 MED ORDER — ACETAMINOPHEN-CODEINE #3 300-30 MG PO TABS: 1.0000 | ORAL_TABLET | Freq: Four times a day (QID) | ORAL | 0 refills | Status: AC | PRN

## 2020-12-24 MED ORDER — IBUPROFEN 800 MG PO TABS: 800.0000 mg | ORAL_TABLET | Freq: Three times a day (TID) | ORAL | 1 refills | Status: DC | PRN

## 2020-12-24 NOTE — Addendum Note (Signed)
Addended byCandice Camp on: 12/24/2020 11:43 AM   Modules accepted: Orders
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
## 2020-12-24 NOTE — Patient Instructions (Addendum)
Surgery on Tuesday   STOP HYDRO-CODONE  START PHENERGAN, CODEINE AND IBUPROFEN

## 2020-12-25 NOTE — Patient Instructions (Signed)
Frank White  12/25/2020     @PREFPERIOPPHARMACY @   Your procedure is scheduled on 12/29/2020   Report to [facility] at  1120  A.M.   Call this number if you have problems the morning of surgery:  [phone]   Remember:  Do not eat or drink after midnight.                       Take these medicines the morning of surgery with A SIP OF WATER  Tylenol #3 or fioricet (if needed), amlodipine, phenergan (if needed).  Place clean sheets on your bed the night before your procedure and DO NOT sleep with pets this night.  Shower with CHG the night before and the morning of your procedure. DO NOT put CHG on your face, hair or genitals.  After each shower, dry off with a clean towel, put on clean, comfortable clothes and brush your teeth.      Do not wear jewelry, make-up or nail polish.  Do not wear lotions, powders, or perfumes, or deodorant.  Do not shave 48 hours prior to surgery.  Men may shave face and neck.  Do not bring valuables to the hospital.  [facility] is not responsible for any belongings or valuables.  Contacts, dentures or bridgework may not be worn into surgery.  Leave your suitcase in the car.  After surgery it may be brought to your room.  For patients admitted to the hospital, discharge time will be determined by your treatment team.  Patients discharged the day of surgery will not be allowed to drive home and must have someone with them for 24 hours.    Special instructions:  DO NOT smoke tobacco or vape the morning of your procedure.    Please read over the following fact sheets that you were given. Coughing and Deep Breathing, Surgical Site Infection Prevention, Anesthesia Post-op Instructions and Care and Recovery After Surgery       Incision and Drainage, Care After This sheet gives you information about how to care for yourself after your procedure. Your health care provider may also give you more specific instructions. If you have  problems or questions, contact your health care provider. What can I expect after the procedure? After the procedure, it is common to have:  Pain or discomfort around the incision site.  Blood, fluid, or pus (drainage) from the incision.  Redness and firm skin around the incision site. Follow these instructions at home: Medicines  Take over-the-counter and prescription medicines only as told by your health care provider.  If you were prescribed an antibiotic medicine, use or take it as told by your health care provider. Do not stop using the antibiotic even if you start to feel better. Wound care Follow instructions from your health care provider about how to take care of your wound. Make sure you:  Wash your hands with soap and water before and after you change your bandage (dressing). If soap and water are not available, use hand sanitizer.  Change your dressing and packing as told by your health care provider. ? If your dressing is dry or stuck when you try to remove it, moisten or wet the dressing with saline or water so that it can be removed without harming your skin or tissues. ? If your wound is packed, leave it in place until your health care provider tells you to remove it. To remove the packing,  moisten or wet the packing with saline or water so that it can be removed without harming your skin or tissues.  Leave stitches (sutures), skin glue, or adhesive strips in place. These skin closures may need to stay in place for 2 weeks or longer. If adhesive strip edges start to loosen and curl up, you may trim the loose edges. Do not remove adhesive strips completely unless your health care provider tells you to do that. Check your wound every day for signs of infection. Check for:  More redness, swelling, or pain.  More fluid or blood.  Warmth.  Pus or a bad smell. If you were sent home with a drain tube in place, follow instructions from your health care provider about:  How  to empty it.  How to care for it at home.   General instructions  Rest the affected area.  Do not take baths, swim, or use a hot tub until your health care provider approves. Ask your health care provider if you may take showers. You may only be allowed to take sponge baths.  Return to your normal activities as told by your health care provider. Ask your health care provider what activities are safe for you. Your health care provider may put you on activity or lifting restrictions.  The incision will continue to drain. It is normal to have some clear or slightly bloody drainage. The amount of drainage should lessen each day.  Do not apply any creams, ointments, or liquids unless you have been told to by your health care provider.  Keep all follow-up visits as told by your health care provider. This is important. Contact a health care provider if:  Your cyst or abscess returns.  You have a fever or chills.  You have more redness, swelling, or pain around your incision.  You have more fluid or blood coming from your incision.  Your incision feels warm to the touch.  You have pus or a bad smell coming from your incision.  You have red streaks above or below the incision site. Get help right away if:  You have severe pain or bleeding.  You cannot eat or drink without vomiting.  You have decreased urine output.  You become short of breath.  You have chest pain.  You cough up blood.  The affected area becomes numb or starts to tingle. These symptoms may represent a serious problem that is an emergency. Do not wait to see if the symptoms will go away. Get medical help right away. Call your local emergency services (911 in the [place] Do not drive yourself to the hospital. Summary  After this procedure, it is common to have fluid, blood, or pus coming from the surgery site.  Follow all home care instructions. You will be told how to take care of your incision, how to check  for infection, and how to take medicines.  If you were prescribed an antibiotic medicine, take it as told by your health care provider. Do not stop taking the antibiotic even if you start to feel better.  Contact a health care provider if you have increased redness, swelling, or pain around your incision. Get help right away if you have chest pain, you vomit, you cough up blood, or you have shortness of breath.  Keep all follow-up visits as told by your health care provider. This is important. This information is not intended to replace advice given to you by your health care provider. Make sure you discuss  any questions you have with your health care provider. Document Revised: 08/13/2018 Document Reviewed: 08/13/2018 Elsevier Patient Education  2021 [employer] Anesthesia, Adult, Care After This sheet gives you information about how to care for yourself after your procedure. Your health care provider may also give you more specific instructions. If you have problems or questions, contact your health care provider. What can I expect after the procedure? After the procedure, the following side effects are common:  Pain or discomfort at the IV site.  Nausea.  Vomiting.  Sore throat.  Trouble concentrating.  Feeling cold or chills.  Feeling weak or tired.  Sleepiness and fatigue.  Soreness and body aches. These side effects can affect parts of the body that were not involved in surgery. Follow these instructions at home: For the time period you were told by your health care provider:  Rest.  Do not participate in activities where you could fall or become injured.  Do not drive or use machinery.  Do not drink alcohol.  Do not take sleeping pills or medicines that cause drowsiness.  Do not make important decisions or sign legal documents.  Do not take care of children on your own.   Eating and drinking  Follow any instructions from your health care provider  about eating or drinking restrictions.  When you feel hungry, start by eating small amounts of foods that are soft and easy to digest (bland), such as toast. Gradually return to your regular diet.  Drink enough fluid to keep your urine pale yellow.  If you vomit, rehydrate by drinking water, juice, or clear broth. General instructions  If you have sleep apnea, surgery and certain medicines can increase your risk for breathing problems. Follow instructions from your health care provider about wearing your sleep device: ? Anytime you are sleeping, including during daytime naps. ? While taking prescription pain medicines, sleeping medicines, or medicines that make you drowsy.  Have a responsible adult stay with you for the time you are told. It is important to have someone help care for you until you are awake and alert.  Return to your normal activities as told by your health care provider. Ask your health care provider what activities are safe for you.  Take over-the-counter and prescription medicines only as told by your health care provider.  If you smoke, do not smoke without supervision.  Keep all follow-up visits as told by your health care provider. This is important. Contact a health care provider if:  You have nausea or vomiting that does not get better with medicine.  You cannot eat or drink without vomiting.  You have pain that does not get better with medicine.  You are unable to pass urine.  You develop a skin rash.  You have a fever.  You have redness around your IV site that gets worse. Get help right away if:  You have difficulty breathing.  You have chest pain.  You have blood in your urine or stool, or you vomit blood. Summary  After the procedure, it is common to have a sore throat or nausea. It is also common to feel tired.  Have a responsible adult stay with you for the time you are told. It is important to have someone help care for you until you  are awake and alert.  When you feel hungry, start by eating small amounts of foods that are soft and easy to digest (bland), such as toast. Gradually return to your regular diet.  Drink enough fluid to keep your urine pale yellow.  Return to your normal activities as told by your health care provider. Ask your health care provider what activities are safe for you. This information is not intended to replace advice given to you by your health care provider. Make sure you discuss any questions you have with your health care provider. Document Revised: 05/28/2020 Document Reviewed: 12/26/2019 Elsevier Patient Education  2021 [employer].

## 2020-12-28 ENCOUNTER — Other Ambulatory Visit: Payer: Self-pay

## 2020-12-28 ENCOUNTER — Telehealth: Payer: Self-pay | Admitting: Radiology

## 2020-12-28 ENCOUNTER — Encounter (HOSPITAL_COMMUNITY)
Admission: RE | Admit: 2020-12-28 | Discharge: 2020-12-28 | Disposition: A | Discharge status: Home / Self Care | Payer: Medicare Other | Source: Ambulatory Visit | Attending: Orthopedic Surgery | Admitting: Orthopedic Surgery

## 2020-12-28 ENCOUNTER — Other Ambulatory Visit (HOSPITAL_COMMUNITY)
Admission: RE | Admit: 2020-12-28 | Discharge: 2020-12-28 | Disposition: A | Discharge status: Home / Self Care | Payer: Medicare Other | Source: Ambulatory Visit | Attending: Orthopedic Surgery | Admitting: Orthopedic Surgery

## 2020-12-28 DIAGNOSIS — Z01812 Encounter for preprocedural laboratory examination: Secondary | ICD-10-CM | POA: Insufficient documentation

## 2020-12-28 DIAGNOSIS — Z01818 Encounter for other preprocedural examination: Secondary | ICD-10-CM | POA: Insufficient documentation

## 2020-12-28 DIAGNOSIS — Z20822 Contact with and (suspected) exposure to covid-19: Secondary | ICD-10-CM | POA: Diagnosis not present

## 2020-12-28 LAB — BASIC METABOLIC PANEL
Anion gap: 12 (ref 5–15)
BUN: 18 mg/dL (ref 6–20)
CO2: 25 mmol/L (ref 22–32)
Calcium: 9.5 mg/dL (ref 8.9–10.3)
Chloride: 101 mmol/L (ref 98–111)
Creatinine, Ser: 0.81 mg/dL (ref 0.61–1.24)
GFR, Estimated: >60 mL/min (ref >60)
Glucose, Bld: 81 mg/dL (ref 70–99)
Potassium: 3.2 mmol/L — ABNORMAL LOW (ref 3.5–5.1)
Sodium: 138 mmol/L (ref 135–145)

## 2020-12-28 LAB — CBC WITH DIFFERENTIAL/PLATELET
Abs Immature Granulocytes: 0.03 10*3/uL (ref 0.00–0.07)
Basophils Absolute: 0.1 10*3/uL (ref 0.0–0.1)
Basophils Relative: 1 %
Eosinophils Absolute: 0.2 10*3/uL (ref 0.0–0.5)
Eosinophils Relative: 2 %
HCT: 45.2 % (ref 39.0–52.0)
Hemoglobin: 15.2 g/dL (ref 13.0–17.0)
Immature Granulocytes: 0 %
Lymphocytes Relative: 29 %
Lymphs Abs: 3 10*3/uL (ref 0.7–4.0)
MCH: 29.9 pg (ref 26.0–34.0)
MCHC: 33.6 g/dL (ref 30.0–36.0)
MCV: 88.8 fL (ref 80.0–100.0)
Monocytes Absolute: 1.1 10*3/uL — ABNORMAL HIGH (ref 0.1–1.0)
Monocytes Relative: 11 %
Neutro Abs: 5.8 10*3/uL (ref 1.7–7.7)
Neutrophils Relative %: 57 %
Platelets: 279 10*3/uL (ref 150–400)
RBC: 5.09 MIL/uL (ref 4.22–5.81)
RDW: 12.5 % (ref 11.5–15.5)
WBC: 10.1 10*3/uL (ref 4.0–10.5)
nRBC: 0 % (ref 0.0–0.2)

## 2020-12-28 NOTE — Telephone Encounter (Signed)
Patient called finger draining Proceed as scheduled per Dr Aline Brochure I will call him later today make sure he is bandaging it

## 2020-12-28 NOTE — Telephone Encounter (Signed)
I called his wife we discussed proceed as scheduled, and keep bandaged, they voiced understanding.

## 2020-12-28 NOTE — H&P (Signed)
Chief Complaint  Patient presents with  . Hand Pain      Has FB (BB ) in the right middle finger swells occasionally but now swollen red inflamed since 12/15/20    History of present illness 60 year old male had a foreign body which is a BB in his right middle finger since he was a teenager.  About 1-1/2 weeks ago approximately March 22 it started swelling and he had increased pain decreased motion.  Went to the ER had x-rays and the BB can be easily seen and then he has a increased density which appears to be bone like along with the extreme soft tissue swelling.  He cannot sleep at night     Review of Systems  Constitutional: Negative for chills and fever.  Skin:       Increased erythema  Neurological: Negative for tingling.            Past Medical History:  Diagnosis Date  . Hypertension             Past Surgical History:  Procedure Laterality Date  . AMPUTATION   09/07/2012    Procedure: AMPUTATION DIGIT;  Surgeon: Carole Civil, MD;  Location: [facility];  Service: Orthopedics;  Laterality: Left;  left long finger - revision of amputation   . APPENDECTOMY      . COLONOSCOPY N/A 08/08/2018    Procedure: COLONOSCOPY;  Surgeon: Rogene Houston, MD;  Location: [facility];  Service: Endoscopy;  Laterality: N/A;  8:30  . POLYPECTOMY   08/08/2018    Procedure: POLYPECTOMY;  Surgeon: Rogene Houston, MD;  Location: [facility];  Service: Endoscopy;;  colon    . TONSILLECTOMY          Family History  Problem Relation Age of Onset  . Diabetes Other      Social History         Tobacco Use  . Smoking status: Current Every Day Smoker      Packs/day: 1.00      Types: Cigarettes  . Smokeless tobacco: Never Used  Vaping Use  . Vaping Use: Never used  Substance Use Topics  . Alcohol use: No  . Drug use: No      No Known Allergies   Active Medications      Current Meds  Medication Sig  . acetaminophen (TYLENOL) 325 MG tablet Take 650 mg by mouth daily as  needed for moderate pain or headache.  Marland Kitchen amLODipine (NORVASC) 2.5 MG tablet Take 2.5 mg by mouth once a week.   . butalbital-acetaminophen-caffeine (FIORICET, ESGIC) 50-325-40 MG tablet Take 1 tablet by mouth 2 (two) times daily as needed for headache.  . cephALEXin (KEFLEX) 500 MG capsule Take 1 capsule (500 mg total) by mouth 4 (four) times daily for 7 days.  Marland Kitchen lisinopril-hydrochlorothiazide (PRINZIDE,ZESTORETIC) 20-25 MG tablet Take 1 tablet by mouth daily.  . vitamin B-12 (CYANOCOBALAMIN) 500 MCG tablet Take 500 mcg by mouth daily.        BP (!) 181/98   Pulse 67   Ht 5\' 8"  (1.727 m)   Wt 198 lb (89.8 kg)   BMI 30.11 kg/m    Physical Exam   General appearance: Well-developed well-nourished no gross deformities  Cardiovascular normal pulse and perfusion normal color without edema  Neurologically o sensation loss or deficits or pathologic reflexes   Psychological: Awake alert and oriented x3 mood and affect normal   Skin no lacerations or ulcerations no nodularity no palpable masses,  no erythema or nodularity   Musculoskeletal:    Please see the media section of the record for pictures of the lesion   It is red and swollen primarily over the proximal phalanx primarily volarly he has 90 degrees of flexion of the PIP joint neurovascular exam intact         MEDICAL DECISION MAKING   A.      Encounter Diagnosis  Name Primary?  . Swelling of right middle finger Yes      B. DATA ANALYSED:   IMAGING: Interpretation of images: External images showed a BB with the bone density-like mass in the soft tissues and a lot of soft tissue swelling  Orders: As above  Outside records reviewed: ER    C. MANAGEMENT     Incision and drainage right middle finger with removal of foreign body       Arther Abbott, MD   12/23/2020 9:24 AM

## 2020-12-29 ENCOUNTER — Ambulatory Visit (HOSPITAL_COMMUNITY): Payer: Medicare Other | Admitting: Certified Registered Nurse Anesthetist

## 2020-12-29 ENCOUNTER — Encounter (HOSPITAL_COMMUNITY): Payer: Self-pay | Admitting: Orthopedic Surgery

## 2020-12-29 ENCOUNTER — Ambulatory Visit (HOSPITAL_COMMUNITY)
Admission: RE | Admit: 2020-12-29 | Discharge: 2020-12-29 | Disposition: A | Discharge status: Home / Self Care | Payer: Medicare Other | Source: Ambulatory Visit | Attending: Orthopedic Surgery | Admitting: Orthopedic Surgery

## 2020-12-29 ENCOUNTER — Encounter (HOSPITAL_COMMUNITY)
Admission: RE | Disposition: A | Discharge status: Home / Self Care | Payer: Self-pay | Source: Ambulatory Visit | Attending: Orthopedic Surgery

## 2020-12-29 ENCOUNTER — Ambulatory Visit (HOSPITAL_COMMUNITY): Payer: Medicare Other

## 2020-12-29 DIAGNOSIS — Z79899 Other long term (current) drug therapy: Secondary | ICD-10-CM | POA: Diagnosis not present

## 2020-12-29 DIAGNOSIS — M60241 Foreign body granuloma of soft tissue, not elsewhere classified, right hand: Secondary | ICD-10-CM | POA: Insufficient documentation

## 2020-12-29 DIAGNOSIS — M795 Residual foreign body in soft tissue: Secondary | ICD-10-CM | POA: Diagnosis not present

## 2020-12-29 DIAGNOSIS — Z833 Family history of diabetes mellitus: Secondary | ICD-10-CM | POA: Diagnosis not present

## 2020-12-29 DIAGNOSIS — F17210 Nicotine dependence, cigarettes, uncomplicated: Secondary | ICD-10-CM | POA: Diagnosis not present

## 2020-12-29 DIAGNOSIS — L089 Local infection of the skin and subcutaneous tissue, unspecified: Secondary | ICD-10-CM | POA: Insufficient documentation

## 2020-12-29 DIAGNOSIS — S61242A Puncture wound with foreign body of right middle finger without damage to nail, initial encounter: Secondary | ICD-10-CM | POA: Diagnosis not present

## 2020-12-29 DIAGNOSIS — I10 Essential (primary) hypertension: Secondary | ICD-10-CM | POA: Insufficient documentation

## 2020-12-29 DIAGNOSIS — F17200 Nicotine dependence, unspecified, uncomplicated: Secondary | ICD-10-CM | POA: Diagnosis not present

## 2020-12-29 DIAGNOSIS — Z1811 Retained magnetic metal fragments: Secondary | ICD-10-CM | POA: Insufficient documentation

## 2020-12-29 DIAGNOSIS — S60551A Superficial foreign body of right hand, initial encounter: Secondary | ICD-10-CM | POA: Diagnosis not present

## 2020-12-29 DIAGNOSIS — S60452A Superficial foreign body of right middle finger, initial encounter: Secondary | ICD-10-CM | POA: Insufficient documentation

## 2020-12-29 HISTORY — PX: INCISION AND DRAINAGE ABSCESS: SHX5864

## 2020-12-29 LAB — SARS CORONAVIRUS 2 (TAT 6-24 HRS): SARS Coronavirus 2: NEGATIVE

## 2020-12-29 SURGERY — INCISION AND DRAINAGE, ABSCESS
Anesthesia: General | Site: Finger | Laterality: Right

## 2020-12-29 MED ORDER — CEFAZOLIN SODIUM-DEXTROSE 2-4 GM/100ML-% IV SOLN
2.0000 g | INTRAVENOUS | Status: AC
  Administered 2020-12-29: 2 g via INTRAVENOUS
  Filled 2020-12-29: qty 100

## 2020-12-29 MED ORDER — ORAL CARE MOUTH RINSE: 15.0000 mL | Freq: Once | OROMUCOSAL | Status: AC

## 2020-12-29 MED ORDER — LIDOCAINE HCL (PF) 2 % IJ SOLN
INTRAMUSCULAR | Status: AC
  Filled 2020-12-29: qty 5

## 2020-12-29 MED ORDER — ONDANSETRON HCL 4 MG/2ML IJ SOLN
4.0000 mg | Freq: Once | INTRAMUSCULAR | Status: AC
  Administered 2020-12-29: 4 mg via INTRAVENOUS
  Filled 2020-12-29: qty 2

## 2020-12-29 MED ORDER — SODIUM CHLORIDE 0.9 % IR SOLN
Status: DC | PRN
  Administered 2020-12-29: 1000 mL

## 2020-12-29 MED ORDER — LIDOCAINE HCL (CARDIAC) PF 100 MG/5ML IV SOSY
PREFILLED_SYRINGE | INTRAVENOUS | Status: DC | PRN
  Administered 2020-12-29: 80 mg via INTRAVENOUS

## 2020-12-29 MED ORDER — LACTATED RINGERS IV SOLN: INTRAVENOUS | Status: DC

## 2020-12-29 MED ORDER — MIDAZOLAM HCL 2 MG/2ML IJ SOLN
INTRAMUSCULAR | Status: DC | PRN
  Administered 2020-12-29: 2 mg via INTRAVENOUS

## 2020-12-29 MED ORDER — BUPIVACAINE HCL (PF) 0.5 % IJ SOLN
INTRAMUSCULAR | Status: DC | PRN
  Administered 2020-12-29: 10 mL

## 2020-12-29 MED ORDER — ONDANSETRON HCL 4 MG/2ML IJ SOLN
4.0000 mg | Freq: Once | INTRAMUSCULAR | Status: DC | PRN
Start: 2020-12-29 — End: 2020-12-29

## 2020-12-29 MED ORDER — CHLORHEXIDINE GLUCONATE 0.12 % MT SOLN
15.0000 mL | Freq: Once | OROMUCOSAL | Status: AC
  Administered 2020-12-29: 15 mL via OROMUCOSAL

## 2020-12-29 MED ORDER — FENTANYL CITRATE (PF) 100 MCG/2ML IJ SOLN: 25.0000 ug | INTRAMUSCULAR | Status: DC | PRN

## 2020-12-29 MED ORDER — PROPOFOL 10 MG/ML IV BOLUS
INTRAVENOUS | Status: AC
  Filled 2020-12-29: qty 40

## 2020-12-29 MED ORDER — ONDANSETRON HCL 4 MG/2ML IJ SOLN
INTRAMUSCULAR | Status: AC
  Filled 2020-12-29: qty 2

## 2020-12-29 MED ORDER — OXYCODONE HCL 5 MG PO TABS
5.0000 mg | ORAL_TABLET | Freq: Once | ORAL | Status: AC
  Administered 2020-12-29: 5 mg via ORAL
  Filled 2020-12-29: qty 1

## 2020-12-29 MED ORDER — BUPIVACAINE HCL (PF) 0.5 % IJ SOLN
INTRAMUSCULAR | Status: AC
  Filled 2020-12-29: qty 30

## 2020-12-29 MED ORDER — PROPOFOL 10 MG/ML IV BOLUS
INTRAVENOUS | Status: DC | PRN
  Administered 2020-12-29: 20 mg via INTRAVENOUS
  Administered 2020-12-29: 180 mg via INTRAVENOUS

## 2020-12-29 MED ORDER — FENTANYL CITRATE (PF) 100 MCG/2ML IJ SOLN
INTRAMUSCULAR | Status: AC
  Filled 2020-12-29: qty 2

## 2020-12-29 MED ORDER — ONDANSETRON HCL 4 MG/2ML IJ SOLN
INTRAMUSCULAR | Status: DC | PRN
  Administered 2020-12-29: 4 mg via INTRAVENOUS

## 2020-12-29 MED ORDER — MIDAZOLAM HCL 2 MG/2ML IJ SOLN
INTRAMUSCULAR | Status: AC
  Filled 2020-12-29: qty 2

## 2020-12-29 MED ORDER — FENTANYL CITRATE (PF) 250 MCG/5ML IJ SOLN
INTRAMUSCULAR | Status: DC | PRN
  Administered 2020-12-29: 50 ug via INTRAVENOUS
  Administered 2020-12-29 (×3): 25 ug via INTRAVENOUS
  Administered 2020-12-29: 50 ug via INTRAVENOUS
  Administered 2020-12-29: 25 ug via INTRAVENOUS

## 2020-12-29 SURGICAL SUPPLY — 42 items
BANDAGE ELASTIC 2 LF NS (GAUZE/BANDAGES/DRESSINGS) ×1 IMPLANT
BANDAGE ESMARK 4X12 BL STRL LF (DISPOSABLE) ×1 IMPLANT
BNDG CMPR 12X4 ELC STRL LF (DISPOSABLE) ×1
BNDG CMPR MED 5X2 ELC HKLP NS (GAUZE/BANDAGES/DRESSINGS) ×1
BNDG COHESIVE 2X5 TAN STRL LF (GAUZE/BANDAGES/DRESSINGS) IMPLANT
BNDG CONFORM 2 STRL LF (GAUZE/BANDAGES/DRESSINGS) ×2 IMPLANT
BNDG ESMARK 4X12 BLUE STRL LF (DISPOSABLE) ×2
CLOTH BEACON ORANGE TIMEOUT ST (SAFETY) ×2 IMPLANT
COVER LIGHT HANDLE STERIS (MISCELLANEOUS) ×4 IMPLANT
COVER WAND RF STERILE (DRAPES) ×2 IMPLANT
CUFF TOURN SGL QUICK 18X4 (TOURNIQUET CUFF) ×1 IMPLANT
DRAPE ORTHO 2.5IN SPLIT 77X108 (DRAPES) IMPLANT
DRAPE ORTHO SPLIT 77X108 STRL (DRAPES)
DRSG XEROFORM 1X8 (GAUZE/BANDAGES/DRESSINGS) ×1 IMPLANT
ELECT REM PT RETURN 9FT ADLT (ELECTROSURGICAL) ×2
ELECTRODE REM PT RTRN 9FT ADLT (ELECTROSURGICAL) ×1 IMPLANT
GAUZE SPONGE 4X4 12PLY STRL (GAUZE/BANDAGES/DRESSINGS) IMPLANT
GLOVE OPTIFIT SS 8.0 STRL (GLOVE) ×2 IMPLANT
GLOVE SKINSENSE NS SZ8.0 LF (GLOVE) ×1
GLOVE SKINSENSE STRL SZ8.0 LF (GLOVE) ×1 IMPLANT
GLOVE SURG UNDER POLY LF SZ7 (GLOVE) ×5 IMPLANT
GOWN STRL REUS W/TWL LRG LVL3 (GOWN DISPOSABLE) ×2 IMPLANT
GOWN STRL REUS W/TWL XL LVL3 (GOWN DISPOSABLE) ×2 IMPLANT
INST SET MINOR BONE (KITS) ×2 IMPLANT
KIT TURNOVER KIT A (KITS) ×2 IMPLANT
MANIFOLD NEPTUNE II (INSTRUMENTS) ×2 IMPLANT
MARKER SKIN DUAL TIP RULER LAB (MISCELLANEOUS) ×2 IMPLANT
NDL HYPO 21X1.5 SAFETY (NEEDLE) IMPLANT
NEEDLE HYPO 21X1.5 SAFETY (NEEDLE) ×2 IMPLANT
NS IRRIG 1000ML POUR BTL (IV SOLUTION) ×2 IMPLANT
PACK BASIC LIMB (CUSTOM PROCEDURE TRAY) IMPLANT
PACK MINOR (CUSTOM PROCEDURE TRAY) IMPLANT
PAD ABD 5X9 TENDERSORB (GAUZE/BANDAGES/DRESSINGS) IMPLANT
PAD ARMBOARD 7.5X6 YLW CONV (MISCELLANEOUS) ×2 IMPLANT
POSITIONER HAND ALUMI XLG (MISCELLANEOUS) ×1 IMPLANT
SET BASIN LINEN APH (SET/KITS/TRAYS/PACK) ×2 IMPLANT
SPONGE GAUZE 2X2 8PLY STRL LF (GAUZE/BANDAGES/DRESSINGS) ×2 IMPLANT
SUT ETHILON 3 0 PS 1 (SUTURE) ×2 IMPLANT
SWAB CULTURE ESWAB REG 1ML (MISCELLANEOUS) ×2 IMPLANT
SWAB CULTURE LIQ STUART DBL (MISCELLANEOUS) ×2 IMPLANT
SYR BULB IRRIG 60ML STRL (SYRINGE) ×2 IMPLANT
SYR CONTROL 10ML LL (SYRINGE) ×1 IMPLANT

## 2020-12-29 NOTE — Transfer of Care (Signed)
Immediate Anesthesia Transfer of Care Note  Patient: Frank White  Procedure(s) Performed: INCISION AND DRAINAGE GRANULOMA RIGHT MIDDLE FINGER AND FOREIGN BODY REMOVAL (Right Finger)  Patient Location: PACU  Anesthesia Type:General  Level of Consciousness: awake  Airway & Oxygen Therapy: Patient Spontanous Breathing  Post-op Assessment: Report given to RN and Post -op Vital signs reviewed and stable  Post vital signs: Reviewed and stable  Last Vitals:  Vitals Value Taken Time  BP 181/95   Temp 97.6   Pulse 61   Resp 9   SpO2 97     Last Pain:  Vitals:   12/29/20 0859  TempSrc: Oral  PainSc: 0-No pain      Patients Stated Pain Goal: 5 (38/75/64 3329)  Complications: No complications documented.

## 2020-12-29 NOTE — Anesthesia Preprocedure Evaluation (Signed)
Anesthesia Evaluation  Patient identified by MRN, date of birth, ID band Patient awake    Reviewed: Allergy & Precautions, H&P , NPO status , Patient's Chart, lab work & pertinent test results, reviewed documented beta blocker date and time   Airway Mallampati: II  TM Distance: >3 FB Neck ROM: full    Dental no notable dental hx.    Pulmonary neg pulmonary ROS, Current Smoker,    Pulmonary exam normal breath sounds clear to auscultation       Cardiovascular Exercise Tolerance: Good hypertension, negative cardio ROS   Rhythm:regular Rate:Normal     Neuro/Psych negative neurological ROS  negative psych ROS   GI/Hepatic negative GI ROS, Neg liver ROS,   Endo/Other  negative endocrine ROS  Renal/GU negative Renal ROS  negative genitourinary   Musculoskeletal   Abdominal   Peds  Hematology negative hematology ROS (+)   Anesthesia Other Findings   Reproductive/Obstetrics negative OB ROS                             Anesthesia Physical Anesthesia Plan  ASA: II  Anesthesia Plan: General and General LMA   Post-op Pain Management:    Induction:   PONV Risk Score and Plan: Ondansetron  Airway Management Planned:   Additional Equipment:   Intra-op Plan:   Post-operative Plan:   Informed Consent: I have reviewed the patients History and Physical, chart, labs and discussed the procedure including the risks, benefits and alternatives for the proposed anesthesia with the patient or authorized representative who has indicated his/her understanding and acceptance.     Dental Advisory Given  Plan Discussed with: CRNA  Anesthesia Plan Comments:         Anesthesia Quick Evaluation

## 2020-12-29 NOTE — Anesthesia Postprocedure Evaluation (Signed)
Anesthesia Post Note  Patient: Frank White  Procedure(s) Performed: INCISION AND DRAINAGE GRANULOMA RIGHT MIDDLE FINGER AND FOREIGN BODY REMOVAL (Right Finger)  Patient location during evaluation: Phase II Anesthesia Type: General Level of consciousness: awake Pain management: pain level controlled Vital Signs Assessment: post-procedure vital signs reviewed and stable Respiratory status: spontaneous breathing and respiratory function stable Cardiovascular status: blood pressure returned to baseline and stable Postop Assessment: no headache and no apparent nausea or vomiting Anesthetic complications: no Comments: Late entry   No complications documented.   Last Vitals:  Vitals:   12/29/20 1205 12/29/20 1215  BP: (!) 170/91 (!) 148/89  Pulse: (!) 57 (!) 55  Resp: 13 12  Temp:    SpO2: 95% 95%    Last Pain:  Vitals:   12/29/20 1145  TempSrc:   PainSc: [place]

## 2020-12-29 NOTE — Anesthesia Procedure Notes (Signed)
Procedure Name: LMA Insertion Date/Time: 12/29/2020 10:14 AM Performed by: Karna Dupes, CRNA Pre-anesthesia Checklist: Patient identified, Emergency Drugs available, Suction available and Patient being monitored Patient Re-evaluated:Patient Re-evaluated prior to induction Oxygen Delivery Method: Circle system utilized Preoxygenation: Pre-oxygenation with 100% oxygen Induction Type: IV induction LMA: LMA inserted LMA Size: 4.0 Number of attempts: 1 Airway Equipment and Method: Patient positioned with wedge pillow Tube secured with: Tape Dental Injury: Teeth and Oropharynx as per pre-operative assessment

## 2020-12-29 NOTE — Op Note (Signed)
12/29/2020  11:19 AM  PATIENT:  Frank White  60 y.o. male  PRE-OPERATIVE DIAGNOSIS:  infection right middle finger due to retained foreign body  POST-OPERATIVE DIAGNOSIS:  infection right middle finger due to retained foreign body  PROCEDURE:  Procedure(s): INCISION AND DRAINAGE GRANULOMA RIGHT MIDDLE FINGER AND FOREIGN BODY REMOVAL (Right)  SURGEON:  Surgeon(s) and Role:    Carole Civil, MD - Primary  Findings 1 intact BB, dark rust colored tissue with granulomatous reaction around the digital nerve on the ulnar side of the digit, no infectious tissue.  Note: Wound drainage prior to surgery secondary to area opening up secondary to pressure  Surgery was done as follows  The patient was seen in the preop area.  The site was confirmed and marked as right long/middle finger.  Patient was taken to the operating for general anesthesia after sterile prep and drape with Betadine Timeout was completed.  Site was confirmed.  The limb was exsanguinated with a Ford Esmarch tourniquet was elevated to 220 mmHg.  I made the incision obliquely across the proximal phalanx from radial proximal to ulnar distal and continued it in a diagonal fashion across the middle phalanx and lifted up the entire flap the digital nerve was encountered and protected on both sides of the digit the ulnar side had granulomatous tissue around it and had to be dissected to free it.  Sharp dissection was used to remove as much brown tissue as possible leaving behind the normal anatomy we remove the BB.  A portion of the flexor tendon sheath was also involved and had to be excised.  The wound was cultured for anaerobic and aerobic bacteria.  Thorough irrigation was performed and the wound was closed with interrupted nylon suture.  Digital block was performed with plain Marcaine half percent total with 10 cc  Sterile dressing was applied and the tourniquet was released the color of the finger looked normal capillary  refill is excellent    PHYSICIAN ASSISTANT:   ASSISTANTS: none   ANESTHESIA:   general  EBL:  0 mL   BLOOD ADMINISTERED:none  DRAINS: none   Local medication plain Marcaine half percent 10 cc  SPECIMEN: Anaerobic and aerobic culture  DISPOSITION OF SPECIMEN:  Microbiology  COUNTS:  YES  TOURNIQUET:   Total Tourniquet Time Documented: Upper Arm (Right) - 41 minutes Total: Upper Arm (Right) - 41 minutes   DICTATION: .Viviann Spare Dictation  PLAN OF CARE: Discharge to home after PACU  PATIENT DISPOSITION:  PACU - hemodynamically stable.   Delay start of Pharmacological VTE agent (>24hrs) due to surgical blood loss or risk of bleeding: not applicable

## 2020-12-29 NOTE — Interval H&P Note (Signed)
History and Physical Interval Note:  12/29/2020 10:03 AM  Frank White  has presented today for surgery, with the diagnosis of infection right middle finger due to retained foreign body.  The various methods of treatment have been discussed with the patient and family. After consideration of risks, benefits and other options for treatment, the patient has consented to  Procedure(s) with comments: [place] (Right) - pt knows to arrive at 8:30 as a surgical intervention.  The patient's history has been reviewed, patient examined, no change in status, stable for surgery.  I have reviewed the patient's chart and labs.  Questions were answered to the patient's satisfaction.     Arther Abbott

## 2020-12-29 NOTE — Discharge Instructions (Signed)
General Anesthesia, Adult, Care After This sheet gives you information about how to care for yourself after your procedure. Your health care provider may also give you more specific instructions. If you have problems or questions, contact your health care provider. What can I expect after the procedure? After the procedure, the following side effects are common:  Pain or discomfort at the IV site.  Nausea.  Vomiting.  Sore throat.  Trouble concentrating.  Feeling cold or chills.  Feeling weak or tired.  Sleepiness and fatigue.  Soreness and body aches. These side effects can affect parts of the body that were not involved in surgery. Follow these instructions at home: For the time period you were told by your health care provider:  Rest.  Do not participate in activities where you could fall or become injured.  Do not drive or use machinery.  Do not drink alcohol.  Do not take sleeping pills or medicines that cause drowsiness.  Do not make important decisions or sign legal documents.  Do not take care of children on your own.   Eating and drinking  Follow any instructions from your health care provider about eating or drinking restrictions.  When you feel hungry, start by eating small amounts of foods that are soft and easy to digest (bland), such as toast. Gradually return to your regular diet.  Drink enough fluid to keep your urine pale yellow.  If you vomit, rehydrate by drinking water, juice, or clear broth. General instructions  If you have sleep apnea, surgery and certain medicines can increase your risk for breathing problems. Follow instructions from your health care provider about wearing your sleep device: ? Anytime you are sleeping, including during daytime naps. ? While taking prescription pain medicines, sleeping medicines, or medicines that make you drowsy.  Have a responsible adult stay with you for the time you are told. It is important to have  someone help care for you until you are awake and alert.  Return to your normal activities as told by your health care provider. Ask your health care provider what activities are safe for you.  Take over-the-counter and prescription medicines only as told by your health care provider.  If you smoke, do not smoke without supervision.  Keep all follow-up visits as told by your health care provider. This is important. Contact a health care provider if:  You have nausea or vomiting that does not get better with medicine.  You cannot eat or drink without vomiting.  You have pain that does not get better with medicine.  You are unable to pass urine.  You develop a skin rash.  You have a fever.  You have redness around your IV site that gets worse. Get help right away if:  You have difficulty breathing.  You have chest pain.  You have blood in your urine or stool, or you vomit blood. Summary  After the procedure, it is common to have a sore throat or nausea. It is also common to feel tired.  Have a responsible adult stay with you for the time you are told. It is important to have someone help care for you until you are awake and alert.  When you feel hungry, start by eating small amounts of foods that are soft and easy to digest (bland), such as toast. Gradually return to your regular diet.  Drink enough fluid to keep your urine pale yellow.  Return to your normal activities as told by your health care provider.   Ask your health care provider what activities are safe for you. This information is not intended to replace advice given to you by your health care provider. Make sure you discuss any questions you have with your health care provider. Document Revised: 05/28/2020 Document Reviewed: 12/26/2019 Elsevier Patient Education  2021 [employer]. Incision and Drainage, Care After This sheet gives you information about how to care for yourself after your procedure. Your health  care provider may also give you more specific instructions. If you have problems or questions, contact your health care provider. What can I expect after the procedure? After the procedure, it is common to have:  Pain or discomfort around the incision site.  Blood, fluid, or pus (drainage) from the incision.  Redness and firm skin around the incision site. Follow these instructions at home: Medicines  Take over-the-counter and prescription medicines only as told by your health care provider.  If you were prescribed an antibiotic medicine, use or take it as told by your health care provider. Do not stop using the antibiotic even if you start to feel better. Wound care Follow instructions from your health care provider about how to take care of your wound. Make sure you:  Wash your hands with soap and water before and after you change your bandage (dressing). If soap and water are not available, use hand sanitizer.  Change your dressing and packing as told by your health care provider. ? If your dressing is dry or stuck when you try to remove it, moisten or wet the dressing with saline or water so that it can be removed without harming your skin or tissues. ? If your wound is packed, leave it in place until your health care provider tells you to remove it. To remove the packing, moisten or wet the packing with saline or water so that it can be removed without harming your skin or tissues.  Leave stitches (sutures), skin glue, or adhesive strips in place. These skin closures may need to stay in place for 2 weeks or longer. If adhesive strip edges start to loosen and curl up, you may trim the loose edges. Do not remove adhesive strips completely unless your health care provider tells you to do that. Check your wound every day for signs of infection. Check for:  More redness, swelling, or pain.  More fluid or blood.  Warmth.  Pus or a bad smell. If you were sent home with a drain tube in  place, follow instructions from your health care provider about:  How to empty it.  How to care for it at home.   General instructions  Rest the affected area.  Do not take baths, swim, or use a hot tub until your health care provider approves. Ask your health care provider if you may take showers. You may only be allowed to take sponge baths.  Return to your normal activities as told by your health care provider. Ask your health care provider what activities are safe for you. Your health care provider may put you on activity or lifting restrictions.  The incision will continue to drain. It is normal to have some clear or slightly bloody drainage. The amount of drainage should lessen each day.  Do not apply any creams, ointments, or liquids unless you have been told to by your health care provider.  Keep all follow-up visits as told by your health care provider. This is important. Contact a health care provider if:  Your cyst or abscess  returns.  You have a fever or chills.  You have more redness, swelling, or pain around your incision.  You have more fluid or blood coming from your incision.  Your incision feels warm to the touch.  You have pus or a bad smell coming from your incision.  You have red streaks above or below the incision site. Get help right away if:  You have severe pain or bleeding.  You cannot eat or drink without vomiting.  You have decreased urine output.  You become short of breath.  You have chest pain.  You cough up blood.  The affected area becomes numb or starts to tingle. These symptoms may represent a serious problem that is an emergency. Do not wait to see if the symptoms will go away. Get medical help right away. Call your local emergency services (911 in the [place] Do not drive yourself to the hospital. Summary  After this procedure, it is common to have fluid, blood, or pus coming from the surgery site.  Follow all home care  instructions. You will be told how to take care of your incision, how to check for infection, and how to take medicines.  If you were prescribed an antibiotic medicine, take it as told by your health care provider. Do not stop taking the antibiotic even if you start to feel better.  Contact a health care provider if you have increased redness, swelling, or pain around your incision. Get help right away if you have chest pain, you vomit, you cough up blood, or you have shortness of breath.  Keep all follow-up visits as told by your health care provider. This is important. This information is not intended to replace advice given to you by your health care provider. Make sure you discuss any questions you have with your health care provider. Document Revised: 08/13/2018 Document Reviewed: 08/13/2018 Elsevier Patient Education  2021 [employer].

## 2020-12-29 NOTE — Brief Op Note (Addendum)
12/29/2020  11:19 AM  PATIENT:  Frank White  60 y.o. male  PRE-OPERATIVE DIAGNOSIS:  infection right middle finger due to retained foreign body  POST-OPERATIVE DIAGNOSIS:  infection right middle finger due to retained foreign body  PROCEDURE:  Procedure(s): INCISION AND DRAINAGE GRANULOMA RIGHT MIDDLE FINGER AND FOREIGN BODY REMOVAL (Right)  SURGEON:  Surgeon(s) and Role:    Carole Civil, MD - Primary  Findings 1 intact BB, dark rust colored tissue with granulomatous reaction around the digital nerve on the ulnar side of the digit, no infectious tissue.  Note: Wound drainage prior to surgery secondary to area opening up secondary to pressure  Surgery was done as follows  The patient was seen in the preop area.  The site was confirmed and marked as right long/middle finger.  Patient was taken to the operating for general anesthesia after sterile prep and drape with Betadine Timeout was completed.  Site was confirmed.  The limb was exsanguinated with a 4 inch  Esmarch tourniquet was elevated to 220 mmHg.  I made the incision obliquely across the proximal phalanx from radial proximal to ulnar distal and continued it in a diagonal fashion across the middle phalanx and lifted up the entire flap the digital nerve was encountered and protected on both sides of the digit the ulnar side had granulomatous tissue around it and had to be dissected to free it.  Sharp dissection was used to remove as much brown tissue as possible leaving behind the normal anatomy we remove the BB.  A portion of the flexor tendon sheath was also involved and had to be excised.  The flexor tendons were elevated where there was further granulation and granulomatous tissue that had eroded into the phalanx, the wound was cultured for anaerobic and aerobic bacteria.  Thorough irrigation was performed and the wound was closed with interrupted nylon suture.  Digital block was performed with plain Marcaine half percent  total with 10 cc  Sterile dressing was applied and the tourniquet was released the color of the finger looked normal capillary refill is excellent    PHYSICIAN ASSISTANT:   ASSISTANTS: none   ANESTHESIA:   general  EBL:  0 mL   BLOOD ADMINISTERED:none  DRAINS: none   Local medication plain Marcaine half percent 10 cc  SPECIMEN: Anaerobic and aerobic culture  DISPOSITION OF SPECIMEN:  Microbiology  COUNTS:  YES  TOURNIQUET:   Total Tourniquet Time Documented: Upper Arm (Right) - 41 minutes Total: Upper Arm (Right) - 41 minutes   DICTATION: .Viviann Spare Dictation  PLAN OF CARE: Discharge to home after PACU  PATIENT DISPOSITION:  PACU - hemodynamically stable.   Delay start of Pharmacological VTE agent (>24hrs) due to surgical blood loss or risk of bleeding: not applicable

## 2020-12-31 ENCOUNTER — Other Ambulatory Visit: Payer: Self-pay

## 2020-12-31 ENCOUNTER — Ambulatory Visit (INDEPENDENT_AMBULATORY_CARE_PROVIDER_SITE_OTHER): Payer: Medicare Other | Admitting: Orthopedic Surgery

## 2020-12-31 ENCOUNTER — Encounter: Payer: Self-pay | Admitting: Orthopedic Surgery

## 2020-12-31 DIAGNOSIS — S60452A Superficial foreign body of right middle finger, initial encounter: Secondary | ICD-10-CM

## 2020-12-31 NOTE — Progress Notes (Signed)
Chief Complaint  Patient presents with  . Post-op Follow-up    12/29/20 right middle finger     Encounter Diagnosis  Name Primary?  . Foreign body of right middle finger s/p removal and I and D on 12/29/20 Yes    Status post incision and drainage removal of foreign body right long finger.  Culture results are negative so far.  Wound looks good so far.  I did allow for some wound drainage through the area which opened up prior to surgery  Recommend active range of motion.  I emphasized this with him and showed him how to do his exercises.  Follow-up on Tuesday morning

## 2021-01-03 LAB — AEROBIC/ANAEROBIC CULTURE W GRAM STAIN (SURGICAL/DEEP WOUND): Culture: NO GROWTH

## 2021-01-05 ENCOUNTER — Ambulatory Visit (INDEPENDENT_AMBULATORY_CARE_PROVIDER_SITE_OTHER): Payer: Medicare Other | Admitting: Orthopedic Surgery

## 2021-01-05 ENCOUNTER — Other Ambulatory Visit: Payer: Self-pay

## 2021-01-05 ENCOUNTER — Encounter: Payer: Self-pay | Admitting: Orthopedic Surgery

## 2021-01-05 DIAGNOSIS — S60452A Superficial foreign body of right middle finger, initial encounter: Secondary | ICD-10-CM

## 2021-01-05 NOTE — Progress Notes (Signed)
Chief Complaint  Patient presents with  . Post-op Follow-up    12/29/20 right middle finger I and D    Status post removal of BB and granuloma reaction culture results are negative for infection his suture line looks good he has progressed well with his active range of motion  Recommend suture removal postop day 15 continue active range of motion exercises  Keep wound covered until he comes back  Encounter Diagnosis  Name Primary?  . Foreign body of right middle finger s/p removal and I and D on 12/29/20 Yes

## 2021-01-05 NOTE — Patient Instructions (Addendum)
Continue bending exercises  Keep clean

## 2021-01-13 ENCOUNTER — Other Ambulatory Visit: Payer: Self-pay

## 2021-01-13 ENCOUNTER — Encounter: Payer: Self-pay | Admitting: Orthopedic Surgery

## 2021-01-13 ENCOUNTER — Ambulatory Visit (INDEPENDENT_AMBULATORY_CARE_PROVIDER_SITE_OTHER): Payer: Medicare Other | Admitting: Orthopedic Surgery

## 2021-01-13 DIAGNOSIS — T8149XA Infection following a procedure, other surgical site, initial encounter: Secondary | ICD-10-CM

## 2021-01-13 DIAGNOSIS — S60452D Superficial foreign body of right middle finger, subsequent encounter: Secondary | ICD-10-CM

## 2021-01-13 DIAGNOSIS — S60452A Superficial foreign body of right middle finger, initial encounter: Secondary | ICD-10-CM

## 2021-01-13 MED ORDER — CEPHALEXIN 500 MG PO CAPS: 500.0000 mg | ORAL_CAPSULE | Freq: Two times a day (BID) | ORAL | 1 refills | Status: AC

## 2021-01-13 NOTE — Progress Notes (Signed)
Chief Complaint  Patient presents with  . Post-op Follow-up    Hand FB removal right 12/29/20 improving sutures out today    12/29/2020  Operative cultures were negative however the patient has noticed increased pain and swelling and some erythema on the proximal part of the incision  This area is tender blanches color returned back to redness there is swelling there is a little opening of the wound recommend Keflex and soaks  Come back in a week PRE-OPERATIVE DIAGNOSIS:  infection right middle finger due to retained foreign body  POST-OPERATIVE DIAGNOSIS:  infection right middle finger due to retained foreign body  PROCEDURE:  Procedure(s): INCISION AND DRAINAGE GRANULOMA RIGHT MIDDLE FINGER AND FOREIGN BODY REMOVAL (Right)  SURGEON:  Surgeon(s) and Role:    Carole Civil, MD - Primary  Findings 1 intact BB, dark rust colored tissue with granulomatous reaction around the digital nerve on the ulnar side of the digit, no infectious tissue.  Note: Wound drainage prior to surgery secondary to area opening up secondary to pressure  Encounter Diagnoses  Name Primary?  . Foreign body of right middle finger s/p removal and I and D on 12/29/20 Yes  . Cellulitis, wound, post-operative

## 2021-01-13 NOTE — Patient Instructions (Signed)
Soak the entire hand in warm water Epson salt drop the dish detergent 10 minutes twice a day  Start antibiotics  Return in a week

## 2021-01-20 ENCOUNTER — Ambulatory Visit (INDEPENDENT_AMBULATORY_CARE_PROVIDER_SITE_OTHER): Payer: Medicare Other | Admitting: Orthopedic Surgery

## 2021-01-20 ENCOUNTER — Other Ambulatory Visit: Payer: Self-pay

## 2021-01-20 ENCOUNTER — Encounter: Payer: Self-pay | Admitting: Orthopedic Surgery

## 2021-01-20 DIAGNOSIS — S60452A Superficial foreign body of right middle finger, initial encounter: Secondary | ICD-10-CM

## 2021-01-20 NOTE — Progress Notes (Signed)
Chief Complaint  Patient presents with  . Post-op Follow-up    Foreign body Right Middle Finger S/P I&D on 12/29/20   POD # 22  SEEMED TO BE DEVELOPING POST OP CELLULITIS LAST TIME   OP CX NEGATIVE   Patient's wound continues to improve its almost closed I like him to continue soaking with salt twice a day add Neosporin keep covered continue cephalexin and come back in a week

## 2021-01-20 NOTE — Patient Instructions (Signed)
Continue soaks twice daily Put neosporin on incision  Continue to work on moving the finger especially bending it

## 2021-01-27 ENCOUNTER — Ambulatory Visit (INDEPENDENT_AMBULATORY_CARE_PROVIDER_SITE_OTHER): Payer: Medicare Other | Admitting: Orthopedic Surgery

## 2021-01-27 ENCOUNTER — Encounter: Payer: Self-pay | Admitting: Orthopedic Surgery

## 2021-01-27 ENCOUNTER — Other Ambulatory Visit: Payer: Self-pay

## 2021-01-27 DIAGNOSIS — S60452D Superficial foreign body of right middle finger, subsequent encounter: Secondary | ICD-10-CM

## 2021-01-27 DIAGNOSIS — S60452A Superficial foreign body of right middle finger, initial encounter: Secondary | ICD-10-CM

## 2021-01-27 DIAGNOSIS — T8149XA Infection following a procedure, other surgical site, initial encounter: Secondary | ICD-10-CM

## 2021-01-27 DIAGNOSIS — Z4889 Encounter for other specified surgical aftercare: Secondary | ICD-10-CM

## 2021-01-27 DIAGNOSIS — T8149XD Infection following a procedure, other surgical site, subsequent encounter: Secondary | ICD-10-CM

## 2021-01-27 NOTE — Progress Notes (Signed)
Chief Complaint  Patient presents with  . Post-op Follow-up    12/29/20 right middle finger    Frank White continues to get better with his finger the redness is mostly gone he has a lot of peers to be a blood blister on the intertriginous region but his range of motion has improved to near full normal he is having no pain no drainage his wound is closed  Recommend 3-week follow-up

## 2021-02-08 DIAGNOSIS — E785 Hyperlipidemia, unspecified: Secondary | ICD-10-CM | POA: Diagnosis not present

## 2021-02-08 DIAGNOSIS — I10 Essential (primary) hypertension: Secondary | ICD-10-CM | POA: Diagnosis not present

## 2021-02-08 DIAGNOSIS — R7301 Impaired fasting glucose: Secondary | ICD-10-CM | POA: Diagnosis not present

## 2021-02-15 DIAGNOSIS — E785 Hyperlipidemia, unspecified: Secondary | ICD-10-CM | POA: Diagnosis not present

## 2021-02-15 DIAGNOSIS — E782 Mixed hyperlipidemia: Secondary | ICD-10-CM | POA: Diagnosis not present

## 2021-02-15 DIAGNOSIS — E876 Hypokalemia: Secondary | ICD-10-CM | POA: Diagnosis not present

## 2021-02-15 DIAGNOSIS — I10 Essential (primary) hypertension: Secondary | ICD-10-CM | POA: Diagnosis not present

## 2021-02-18 ENCOUNTER — Other Ambulatory Visit: Payer: Self-pay

## 2021-02-18 ENCOUNTER — Ambulatory Visit (INDEPENDENT_AMBULATORY_CARE_PROVIDER_SITE_OTHER): Payer: Medicare Other | Admitting: Orthopedic Surgery

## 2021-02-18 DIAGNOSIS — M545 Low back pain, unspecified: Secondary | ICD-10-CM

## 2021-02-18 DIAGNOSIS — S60452A Superficial foreign body of right middle finger, initial encounter: Secondary | ICD-10-CM

## 2021-02-18 MED ORDER — PREDNISONE 10 MG (48) PO TBPK: ORAL_TABLET | Freq: Every day | ORAL | 0 refills | Status: DC

## 2021-02-18 MED ORDER — METHOCARBAMOL 500 MG PO TABS: 500.0000 mg | ORAL_TABLET | Freq: Three times a day (TID) | ORAL | 1 refills | Status: DC

## 2021-02-18 NOTE — Patient Instructions (Signed)
Acute Back Pain, Adult Acute back pain is sudden and usually short-lived. It is often caused by an injury to the muscles and tissues in the back. The injury may result from:  A muscle or ligament getting overstretched or torn (strained). Ligaments are tissues that connect bones to each other. Lifting something improperly can cause a back strain.  Wear and tear (degeneration) of the spinal disks. Spinal disks are circular tissue that provide cushioning between the bones of the spine (vertebrae).  Twisting motions, such as while playing sports or doing yard work.  A hit to the back.  Arthritis. You may have a physical exam, lab tests, and imaging tests to find the cause of your pain. Acute back pain usually goes away with rest and home care. Follow these instructions at home: Managing pain, stiffness, and swelling  Treatment may include medicines for pain and inflammation that are taken by mouth or applied to the skin, prescription pain medicine, or muscle relaxants. Take over-the-counter and prescription medicines only as told by your health care provider.  Your health care provider may recommend applying ice during the first 24-48 hours after your pain starts. To do this: ? Put ice in a plastic bag. ? Place a towel between your skin and the bag. ? Leave the ice on for 20 minutes, 2-3 times a day.  If directed, apply heat to the affected area as often as told by your health care provider. Use the heat source that your health care provider recommends, such as a moist heat pack or a heating pad. ? Place a towel between your skin and the heat source. ? Leave the heat on for 20-30 minutes. ? Remove the heat if your skin turns bright red. This is especially important if you are unable to feel pain, heat, or cold. You have a greater risk of getting burned. Activity  Do not stay in bed. Staying in bed for more than 1-2 days can delay your recovery.  Sit up and stand up straight. Avoid leaning  forward when you sit or hunching over when you stand. ? If you work at a desk, sit close to it so you do not need to lean over. Keep your chin tucked in. Keep your neck drawn back, and keep your elbows bent at a 90-degree angle (right angle). ? Sit high and close to the steering wheel when you drive. Add lower back (lumbar) support to your car seat, if needed.  Take short walks on even surfaces as soon as you are able. Try to increase the length of time you walk each day.  Do not sit, drive, or stand in one place for more than 30 minutes at a time. Sitting or standing for long periods of time can put stress on your back.  Do not drive or use heavy machinery while taking prescription pain medicine.  Use proper lifting techniques. When you bend and lift, use positions that put less stress on your back: ? Bend your knees. ? Keep the load close to your body. ? Avoid twisting.  Exercise regularly as told by your health care provider. Exercising helps your back heal faster and helps prevent back injuries by keeping muscles strong and flexible.  Work with a physical therapist to make a safe exercise program, as recommended by your health care provider. Do any exercises as told by your physical therapist.   Lifestyle  Maintain a healthy weight. Extra weight puts stress on your back and makes it difficult to have   good posture.  Avoid activities or situations that make you feel anxious or stressed. Stress and anxiety increase muscle tension and can make back pain worse. Learn ways to manage anxiety and stress, such as through exercise. General instructions  Sleep on a firm mattress in a comfortable position. Try lying on your side with your knees slightly bent. If you lie on your back, put a pillow under your knees.  Follow your treatment plan as told by your health care provider. This may include: ? Cognitive or behavioral therapy. ? Acupuncture or massage therapy. ? Meditation or yoga. Contact  a health care provider if:  You have pain that is not relieved with rest or medicine.  You have increasing pain going down into your legs or buttocks.  Your pain does not improve after 2 weeks.  You have pain at night.  You lose weight without trying.  You have a fever or chills. Get help right away if:  You develop new bowel or bladder control problems.  You have unusual weakness or numbness in your arms or legs.  You develop nausea or vomiting.  You develop abdominal pain.  You feel faint. Summary  Acute back pain is sudden and usually short-lived.  Use proper lifting techniques. When you bend and lift, use positions that put less stress on your back.  Take over-the-counter and prescription medicines and apply heat or ice as directed by your health care provider. This information is not intended to replace advice given to you by your health care provider. Make sure you discuss any questions you have with your health care provider. Document Revised: 06/05/2020 Document Reviewed: 06/05/2020 Elsevier Patient Education  2021 [employer]  

## 2021-02-18 NOTE — Progress Notes (Signed)
Chief Complaint  Patient presents with  . Routine Post Op    3 wk follow up DOS 12/29/20    Encounter Diagnoses  Name Primary?  . Foreign body of right middle finger s/p removal and I and D on 12/29/20   . Acute midline low back pain without sciatica Yes    Frank White presented for evaluation of his right middle finger status post removal of foreign body incision and drainage on December 29, 2020.  His wounds have finally healed up he is regained full range of motion he has a blood blister on the medial side of the proximal phalanx  New complaint today  Lower back pain for 3 days  The patient was working in his yard moving some stones felt some pain in his back and over the next day he developed stiffness.  He complains of bilateral midline lower back pain nonradiating no associated numbness or tingling just stiffness  Examination reveals decreased flexion extension tenderness in the lower back normal neurovascular exam distally  Recommend medication and rest  Meds ordered this encounter  Medications  . predniSONE (STERAPRED UNI-PAK 48 TAB) 10 MG (48) TBPK tablet    Sig: Take by mouth daily. 10 mg 12 days    Dispense:  48 tablet    Refill:  0  . methocarbamol (ROBAXIN) 500 MG tablet    Sig: Take 1 tablet (500 mg total) by mouth 3 (three) times daily.    Dispense:  60 tablet    Refill:  1

## 2021-04-24 ENCOUNTER — Emergency Department (HOSPITAL_COMMUNITY): Payer: Medicare Other

## 2021-04-24 ENCOUNTER — Other Ambulatory Visit: Payer: Self-pay

## 2021-04-24 ENCOUNTER — Emergency Department (HOSPITAL_COMMUNITY)
Admission: EM | Admit: 2021-04-24 | Discharge: 2021-04-24 | Disposition: A | Discharge status: Home / Self Care | Payer: Medicare Other | Attending: Emergency Medicine | Admitting: Emergency Medicine

## 2021-04-24 DIAGNOSIS — F17210 Nicotine dependence, cigarettes, uncomplicated: Secondary | ICD-10-CM | POA: Diagnosis not present

## 2021-04-24 DIAGNOSIS — M5412 Radiculopathy, cervical region: Secondary | ICD-10-CM | POA: Diagnosis not present

## 2021-04-24 DIAGNOSIS — M542 Cervicalgia: Secondary | ICD-10-CM | POA: Diagnosis not present

## 2021-04-24 DIAGNOSIS — M25511 Pain in right shoulder: Secondary | ICD-10-CM | POA: Diagnosis not present

## 2021-04-24 DIAGNOSIS — I10 Essential (primary) hypertension: Secondary | ICD-10-CM | POA: Diagnosis not present

## 2021-04-24 DIAGNOSIS — R001 Bradycardia, unspecified: Secondary | ICD-10-CM

## 2021-04-24 DIAGNOSIS — Z79899 Other long term (current) drug therapy: Secondary | ICD-10-CM | POA: Diagnosis not present

## 2021-04-24 MED ORDER — OXYCODONE-ACETAMINOPHEN 5-325 MG PO TABS
1.0000 | ORAL_TABLET | Freq: Once | ORAL | Status: AC
  Administered 2021-04-24: 1 via ORAL
  Filled 2021-04-24: qty 1

## 2021-04-24 MED ORDER — PREDNISONE 10 MG (21) PO TBPK: ORAL_TABLET | Freq: Every day | ORAL | 0 refills | Status: DC

## 2021-04-24 MED ORDER — ACETAMINOPHEN 325 MG PO TABS
650.0000 mg | ORAL_TABLET | Freq: Once | ORAL | Status: AC
  Administered 2021-04-24: 650 mg via ORAL
  Filled 2021-04-24: qty 2

## 2021-04-24 NOTE — ED Provider Notes (Signed)
[facility] EMERGENCY DEPARTMENT Provider Note   CSN: [id number] Arrival date & time: 04/24/21  0825     History Chief Complaint  Patient presents with   Shoulder Pain    Frank White is a 60 y.o. male with a past medical history of hypertension presenting to the ED with a chief complaint of right shoulder pain.  States that he woke up yesterday morning about 24 hours ago started having aching pain in his right shoulder and down his right arm.  He tried taking a painkiller and muscle relaxer but did not have significant relief from this.  Now is having pain in his neck area as well.  Denies any injury or trauma, overuse recently.  No joint swelling, paresthesias, numbness, weakness or fever.  He denies similar symptoms in the past.  Denies any chest pain, shortness of breath, headache, vision changes or prior neck surgery.  Pain is worse with movement and palpation.  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    Past Surgical History:  Procedure Laterality Date   AMPUTATION  09/07/2012   Procedure: AMPUTATION DIGIT;  Surgeon: Carole Civil, MD;  Location: [facility];  Service: Orthopedics;  Laterality: Left;  left long finger - revision of amputation    APPENDECTOMY     COLONOSCOPY N/A 08/08/2018   Procedure: COLONOSCOPY;  Surgeon: Rogene Houston, MD;  Location: [facility];  Service: Endoscopy;  Laterality: N/A;  8:30   INCISION AND DRAINAGE ABSCESS Right 12/29/2020   Procedure: INCISION AND DRAINAGE GRANULOMA RIGHT MIDDLE FINGER AND FOREIGN BODY REMOVAL;  Surgeon: Carole Civil, MD;  Location: [facility];  Service: Orthopedics;  Laterality: Right;    POLYPECTOMY  08/08/2018   Procedure: POLYPECTOMY;  Surgeon: Rogene Houston, MD;  Location: [facility];  Service: Endoscopy;;  colon    TONSILLECTOMY         Family History  Problem Relation Age of Onset   Diabetes Other     Social History   Tobacco Use   Smoking status: Every Day    Packs/day: 1.00    Types: Cigarettes   Smokeless tobacco: Never  Vaping Use   Vaping Use: Never used  Substance Use Topics   Alcohol use: No   Drug use: No    Home Medications Prior to Admission medications   Medication Sig Start Date End Date Taking? Authorizing Provider  predniSONE (STERAPRED UNI-PAK 21 TAB) 10 MG (21) TBPK tablet Take by mouth daily. Take 6 tabs by mouth daily  for 2 days, then 5 tabs for 2 days, then 4 tabs for 2 days, then 3 tabs for 2 days, 2 tabs for 2 days, then 1 tab by mouth daily for 2 days 04/24/21  Yes Spiros Greenfeld, PA-C  acetaminophen (TYLENOL) 325 MG tablet Take 650 mg by mouth daily as needed for moderate pain or headache.    [provider]  amLODipine (NORVASC) 2.5 MG tablet Take 2.5 mg by mouth 3 (three) times a week.    [provider]  butalbital-acetaminophen-caffeine (FIORICET, ESGIC) 50-325-40 MG tablet Take 1 tablet by mouth 2 (two) times daily  as needed for headache.    [provider]  Carboxymethylcellul-Glycerin (LUBRICATING EYE DROPS OP) Place 1 drop into both eyes daily as needed (dry eyes).    [provider]  Cholecalciferol (VITAMIN D) 50 MCG (2000 UT) tablet Take 2,000 Units by mouth 3 (three) times a week.    [provider]  ibuprofen (ADVIL) 800 MG tablet Take 1 tablet (800 mg total) by mouth every 8 (eight) hours as needed. Patient taking differently: Take 800 mg by mouth every 8 (eight) hours as needed for moderate pain. 12/24/20   Carole Civil, MD  lisinopril-hydrochlorothiazide (PRINZIDE,ZESTORETIC) 20-25 MG tablet Take 1 tablet by mouth daily.    [provider]  methocarbamol  (ROBAXIN) 500 MG tablet Take 1 tablet (500 mg total) by mouth 3 (three) times daily. 02/18/21   Carole Civil, MD  promethazine (PHENERGAN) 12.5 MG tablet Take 1 tablet (12.5 mg total) by mouth every 6 (six) hours as needed for nausea or vomiting. 12/24/20   Carole Civil, MD  vitamin B-12 (CYANOCOBALAMIN) 500 MCG tablet Take 500 mcg by mouth daily.    [provider]    Allergies    Hydrocodone  Review of Systems   Review of Systems  Constitutional:  Negative for appetite change, chills and fever.  HENT:  Negative for ear pain, rhinorrhea, sneezing and sore throat.   Eyes:  Negative for photophobia and visual disturbance.  Respiratory:  Negative for cough, chest tightness, shortness of breath and wheezing.   Cardiovascular:  Negative for chest pain and palpitations.  Gastrointestinal:  Negative for abdominal pain, blood in stool, constipation, diarrhea, nausea and vomiting.  Genitourinary:  Negative for dysuria, hematuria and urgency.  Musculoskeletal:  Positive for arthralgias and myalgias.  Skin:  Negative for rash.  Neurological:  Negative for dizziness, weakness and light-headedness.   Physical Exam Updated Vital Signs BP (!) 180/87   Pulse (!) 50   Temp 97.7 F (36.5 C) (Oral)   Resp 14   Ht '5\' 8"'$  (1.727 m)   Wt 91.9 kg   SpO2 97%   BMI 30.81 kg/m   Physical Exam Vitals and nursing note reviewed.  Constitutional:      General: He is not in acute distress.    Appearance: He is well-developed.  HENT:     Head: Normocephalic and atraumatic.     Nose: Nose normal.  Eyes:     General: No scleral icterus.       Left eye: No discharge.     Conjunctiva/sclera: Conjunctivae normal.  Neck:      Comments: Tenderness to patient of the indicated area near the cervical spine.  No step-off palpated.  Tenderness noted to right scapula area.  No changes to range of motion.  No deformities.  No erythema, edema or warmth of joint.  2+ radial pulse palpated  bilaterally.  Compartments are soft.  Strength 5/5 in bilateral upper extremities. Cardiovascular:     Rate and Rhythm: Normal rate and regular rhythm.     Heart sounds: Normal heart sounds. No murmur heard.   No friction rub. No gallop.  Pulmonary:     Effort: Pulmonary effort is normal. No respiratory distress.     Breath sounds: Normal breath sounds.  Abdominal:     General: Bowel sounds are normal. There is no distension.     Palpations: Abdomen is soft.     Tenderness: There is no abdominal tenderness. There is no guarding.  Musculoskeletal:  General: Normal range of motion.     Cervical back: Normal range of motion and neck supple.  Skin:    General: Skin is warm and dry.     Findings: No rash.  Neurological:     Mental Status: He is alert.     Motor: No abnormal muscle tone.     Coordination: Coordination normal.    ED Results / Procedures / Treatments   Labs (all labs ordered are listed, but only abnormal results are displayed) Labs Reviewed - No data to display  EKG EKG Interpretation  Date/Time:  Saturday April 24 2021 08:42:31 EDT Ventricular Rate:  49 PR Interval:  164 QRS Duration: 87 QT Interval:  438 QTC Calculation: 396 R Axis:   -7 Text Interpretation: Sinus bradycardia Left ventricular hypertrophy Borderline T abnormalities, lateral leads ST elevation, consider anterior injury Confirmed by Milton Ferguson [phone]) on 04/24/2021 11:14:59 AM  Radiology DG Shoulder Right  Result Date: 04/24/2021 CLINICAL DATA:  Right-sided shoulder pain without trauma EXAM: RIGHT SHOULDER - 2+ VIEW COMPARISON:  None. FINDINGS: No acute fracture or dislocation. Visualized portion of the right hemithorax is normal. IMPRESSION: No acute osseous abnormality. Electronically Signed   By: Abigail Miyamoto M.D.   On: 04/24/2021 10:39   CT Cervical Spine Wo Contrast  Result Date: 04/24/2021 CLINICAL DATA:  60 year old male with acute neck pain radiating to the right shoulder since  yesterday. No known injury. EXAM: CT CERVICAL SPINE WITHOUT CONTRAST TECHNIQUE: Multidetector CT imaging of the cervical spine was performed without intravenous contrast. Multiplanar CT image reconstructions were also generated. COMPARISON:  None. FINDINGS: Alignment: Straightening of cervical lordosis. Cervicothoracic junction alignment is within normal limits. Bilateral posterior element alignment is within normal limits. Skull base and vertebrae: Bone mineralization is within normal limits. Visualized skull base is intact. No atlanto-occipital dissociation. C1 and C2 appear intact and aligned. No acute osseous abnormality identified. Soft tissues and spinal canal: No prevertebral fluid or swelling. No visible canal hematoma. Negative visible noncontrast neck soft tissues. Disc levels: C2-C3: Moderate right side facet hypertrophy and mild right foraminal endplate spurring. Moderate right C3 foraminal stenosis. C3-C4: Mild to moderate facet hypertrophy greater on the left. Mild endplate spurring. Mild to moderate left C4 foraminal stenosis. C4-C5:  Mild facet hypertrophy on the right.  No definite stenosis. C5-C6: Mild circumferential disc bulge and endplate spurring. Mild if any associated left C6 foraminal stenosis. C6-C7: Disc space loss. Left eccentric circumferential disc osteophyte complex. Severe left C7 foraminal stenosis. Suspect mild ligament flavum hypertrophy. Mild if any associated spinal stenosis. C7-T1: Mild disc space loss and endplate spurring. Mild facet hypertrophy. No definite stenosis. Upper chest: Negative. Other: Negative visible noncontrast posterior fossa. Visible sphenoid sinuses, tympanic cavities and mastoids are clear. IMPRESSION: 1. No acute osseous abnormality in the cervical spine. 2. Chronic disc and endplate degeneration at C6-C7 with severe left C7 foraminal stenosis, and possible mild associated spinal stenosis. 3. Moderate right C3 and left C4 neural foraminal stenosis due to  endplate and facet spurring. Electronically Signed   By: Genevie Ann M.D.   On: 04/24/2021 10:34    Procedures Procedures   Medications Ordered in ED Medications  oxyCODONE-acetaminophen (PERCOCET/ROXICET) 5-325 MG per tablet 1 tablet (1 tablet Oral Given 04/24/21 0938)  acetaminophen (TYLENOL) tablet 650 mg (650 mg Oral Given 04/24/21 [id number])    ED Course  I have reviewed the triage vital signs and the nursing notes.  Pertinent labs & imaging results that were available during my care of  the patient were reviewed by me and considered in my medical decision making (see chart for details).    MDM Rules/Calculators/A&P                           60 year old male with past medical history of hypertension presenting to the ED for right shoulder pain and neck pain.  Shoulder pain began yesterday.  Worse with palpation and movement.  On exam no overlying skin changes.  There she has palpation of the cervical spine at the midline and paraspinal musculature.  No changes to range of motion.  Area is neurovascularly intact.  Normal strength and sensation noted.  He is afebrile here.  No meningeal signs noted.  He denies any chest pain, shortness of breath, numbness, paresthesias.  Will obtain imaging, treat pain and reassess.  X-ray of the shoulder without any abnormalities.  CT cervical spine shows no acute process but does show chronic degenerative changes with foraminal stenosis and mild spinal stenosis around C6-C7.  I suspect this may be the cause of his discomfort and pain. EKG similar to priors but does show sinus bradycardia.  Patient states that he was told in the past by his cardiologist that "my heart rate is slow but they do not know why."  We will have him follow-up with cardiology regarding this.  Will treat with steroids and have him follow-up with the neurosurgeon.  Doubt infectious or vascular cause of his symptoms.  He has acetaminophen-codeine at home as well as muscle relaxer so we will have  him continue this.  Return precautions given.   Patient is hemodynamically stable, in NAD, and able to ambulate in the ED. Evaluation does not show pathology that would require ongoing emergent intervention or inpatient treatment. I explained the diagnosis to the patient. Pain has been managed and has no complaints prior to discharge. Patient is comfortable with above plan and is stable for discharge at this time. All questions were answered prior to disposition. Strict return precautions for returning to the ED were discussed. Encouraged follow up with PCP.   An After Visit Summary was printed and given to the patient.   Portions of this note were generated with [employer]. Dictation errors may occur despite best attempts at proofreading.  Final Clinical Impression(s) / ED Diagnoses Final diagnoses:  Cervical radiculopathy  Sinus bradycardia    Rx / DC Orders ED Discharge Orders          Ordered    predniSONE (STERAPRED UNI-PAK 21 TAB) 10 MG (21) TBPK tablet  Daily        04/24/21 [address] PA-C 04/24/21 1120    Milton Ferguson, MD 04/26/21 [phone]

## 2021-04-24 NOTE — ED Triage Notes (Signed)
Pt has right side shoulder pain that began when he woke yesterday.

## 2021-04-24 NOTE — Discharge Instructions (Addendum)
Take the steroids to help with your discomfort. Continue taking your pain medication and muscle relaxer. Follow-up with the neurosurgeon listed below regarding your degenerative changes in your C6-C7 vertebrae. Your EKG showed that you have a slow heart rate in the 50s to 40s.  Follow-up with your cardiologist regarding this. Return to the ER if you start to experience worsening pain, stiffness of your neck, fever, chest pain, shortness of breath or weakness   Your CT cervical spine reading is follows: IMPRESSION: 1. No acute osseous abnormality in the cervical spine. 2. Chronic disc and endplate degeneration at C6-C7 with severe left C7 foraminal stenosis, and possible mild associated spinal stenosis. 3. Moderate right C3 and left C4 neural foraminal stenosis due to endplate and facet spurring.

## 2021-04-27 ENCOUNTER — Encounter (HOSPITAL_COMMUNITY): Payer: Self-pay | Admitting: *Deleted

## 2021-04-27 ENCOUNTER — Emergency Department (HOSPITAL_COMMUNITY): Payer: Medicare Other

## 2021-04-27 ENCOUNTER — Other Ambulatory Visit: Payer: Self-pay

## 2021-04-27 ENCOUNTER — Emergency Department (HOSPITAL_COMMUNITY)
Admission: EM | Admit: 2021-04-27 | Discharge: 2021-04-27 | Disposition: A | Discharge status: Home / Self Care | Payer: Medicare Other | Attending: Emergency Medicine | Admitting: Emergency Medicine

## 2021-04-27 DIAGNOSIS — M5412 Radiculopathy, cervical region: Secondary | ICD-10-CM | POA: Diagnosis not present

## 2021-04-27 DIAGNOSIS — M542 Cervicalgia: Secondary | ICD-10-CM | POA: Diagnosis not present

## 2021-04-27 DIAGNOSIS — F17210 Nicotine dependence, cigarettes, uncomplicated: Secondary | ICD-10-CM | POA: Insufficient documentation

## 2021-04-27 DIAGNOSIS — Z79899 Other long term (current) drug therapy: Secondary | ICD-10-CM | POA: Insufficient documentation

## 2021-04-27 DIAGNOSIS — I10 Essential (primary) hypertension: Secondary | ICD-10-CM | POA: Diagnosis not present

## 2021-04-27 DIAGNOSIS — R200 Anesthesia of skin: Secondary | ICD-10-CM | POA: Insufficient documentation

## 2021-04-27 DIAGNOSIS — Z85048 Personal history of other malignant neoplasm of rectum, rectosigmoid junction, and anus: Secondary | ICD-10-CM | POA: Insufficient documentation

## 2021-04-27 DIAGNOSIS — Z89022 Acquired absence of left finger(s): Secondary | ICD-10-CM | POA: Insufficient documentation

## 2021-04-27 MED ORDER — MORPHINE SULFATE (PF) 4 MG/ML IV SOLN
4.0000 mg | Freq: Once | INTRAVENOUS | Status: AC
Start: 2021-04-27 — End: 2021-04-27
  Administered 2021-04-27: 4 mg via INTRAVENOUS
  Filled 2021-04-27: qty 1

## 2021-04-27 MED ORDER — DIAZEPAM 5 MG PO TABS: 5.0000 mg | ORAL_TABLET | Freq: Two times a day (BID) | ORAL | 0 refills | Status: DC

## 2021-04-27 MED ORDER — AEROCHAMBER PLUS FLO-VU MEDIUM MISC
1.0000 | Freq: Once | Status: DC
  Filled 2021-04-27: qty 1

## 2021-04-27 MED ORDER — ONDANSETRON HCL 4 MG PO TABS: 4.0000 mg | ORAL_TABLET | Freq: Three times a day (TID) | ORAL | 0 refills | Status: DC | PRN

## 2021-04-27 MED ORDER — ONDANSETRON HCL 4 MG/2ML IJ SOLN
4.0000 mg | Freq: Once | INTRAMUSCULAR | Status: AC
  Administered 2021-04-27: 4 mg via INTRAVENOUS
  Filled 2021-04-27: qty 2

## 2021-04-27 MED ORDER — HYDROCODONE-ACETAMINOPHEN 5-325 MG PO TABS: 1.0000 | ORAL_TABLET | ORAL | 0 refills | Status: DC | PRN

## 2021-04-27 MED ORDER — ALBUTEROL SULFATE HFA 108 (90 BASE) MCG/ACT IN AERS
2.0000 | INHALATION_SPRAY | Freq: Once | RESPIRATORY_TRACT | Status: AC
  Administered 2021-04-27: 2 via RESPIRATORY_TRACT
  Filled 2021-04-27: qty 6.7

## 2021-04-27 MED ORDER — DEXAMETHASONE SODIUM PHOSPHATE 10 MG/ML IJ SOLN
10.0000 mg | Freq: Once | INTRAMUSCULAR | Status: AC
  Administered 2021-04-27: 10 mg via INTRAVENOUS
  Filled 2021-04-27: qty 1

## 2021-04-27 NOTE — ED Triage Notes (Signed)
Pain in neck, seen here 3 days ago for same, referred to neurologist. Here for pain control

## 2021-04-27 NOTE — ED Provider Notes (Signed)
[facility] EMERGENCY DEPARTMENT Provider Note   CSN: [id number] Arrival date & time: 04/27/21  1127     History Chief Complaint  Patient presents with   Neck Pain    Frank White is a 60 y.o. male.  Pt presents to the ED today with neck pain and numbness going down right arm.  He also has some right arm weakness.  Pt was seen here on 7/30 for the same, but the pain has gotten worse.  He was d/c with prednisone and has been taking that.  He has been alternating tylenol and ibuprofen without relief of pain.  He made an appointment with Dr. Marcello Moores (NS) for Friday, 8/5.  He is worried that he can't wait that long.        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    Past Surgical History:  Procedure Laterality Date   AMPUTATION  09/07/2012   Procedure: AMPUTATION DIGIT;  Surgeon: Carole Civil, MD;  Location: [facility];  Service: Orthopedics;  Laterality: Left;  left long finger - revision of amputation    APPENDECTOMY     COLONOSCOPY N/A 08/08/2018   Procedure: COLONOSCOPY;  Surgeon: Rogene Houston, MD;  Location: [facility];  Service: Endoscopy;  Laterality: N/A;  8:30   INCISION AND DRAINAGE ABSCESS Right 12/29/2020   Procedure: INCISION AND DRAINAGE GRANULOMA RIGHT MIDDLE FINGER AND FOREIGN BODY REMOVAL;  Surgeon: Carole Civil, MD;  Location: [facility];  Service: Orthopedics;  Laterality: Right;   POLYPECTOMY  08/08/2018   Procedure: POLYPECTOMY;  Surgeon: Rogene Houston, MD;  Location: [facility];  Service: Endoscopy;;  colon    TONSILLECTOMY         Family History  Problem Relation Age of Onset   Diabetes Other     Social History   Tobacco Use    Smoking status: Every Day    Packs/day: 1.00    Types: Cigarettes   Smokeless tobacco: Never  Vaping Use   Vaping Use: Never used  Substance Use Topics   Alcohol use: No   Drug use: No    Home Medications Prior to Admission medications   Medication Sig Start Date End Date Taking? Authorizing Provider  diazepam (VALIUM) 5 MG tablet Take 1 tablet (5 mg total) by mouth 2 (two) times daily. 04/27/21  Yes Isla Pence, MD  HYDROcodone-acetaminophen (NORCO/VICODIN) 5-325 MG tablet Take 1 tablet by mouth every 4 (four) hours as needed. 04/27/21  Yes Isla Pence, MD  ondansetron (ZOFRAN) 4 MG tablet Take 1 tablet (4 mg total) by mouth every 8 (eight) hours as needed for nausea or vomiting. 04/27/21  Yes Isla Pence, MD  acetaminophen (TYLENOL) 325 MG tablet Take 650 mg by mouth daily as needed for moderate pain or headache.    [provider]  amLODipine (NORVASC) 2.5 MG tablet Take 2.5 mg by mouth 3 (three) times a week.    [provider]  butalbital-acetaminophen-caffeine (FIORICET, ESGIC) 50-325-40 MG tablet Take 1 tablet by mouth 2 (two) times daily as needed for headache.    [provider]  Carboxymethylcellul-Glycerin (LUBRICATING EYE DROPS OP) Place 1 drop into  both eyes daily as needed (dry eyes).    [provider]  Cholecalciferol (VITAMIN D) 50 MCG (2000 UT) tablet Take 2,000 Units by mouth 3 (three) times a week.    [provider]  ibuprofen (ADVIL) 800 MG tablet Take 1 tablet (800 mg total) by mouth every 8 (eight) hours as needed. Patient taking differently: Take 800 mg by mouth every 8 (eight) hours as needed for moderate pain. 12/24/20   Carole Civil, MD  lisinopril-hydrochlorothiazide (PRINZIDE,ZESTORETIC) 20-25 MG tablet Take 1 tablet by mouth daily.    [provider]  methocarbamol (ROBAXIN) 500 MG tablet Take 1 tablet (500 mg total) by mouth 3 (three) times daily. 02/18/21   Carole Civil, MD   predniSONE (STERAPRED UNI-PAK 21 TAB) 10 MG (21) TBPK tablet Take by mouth daily. Take 6 tabs by mouth daily  for 2 days, then 5 tabs for 2 days, then 4 tabs for 2 days, then 3 tabs for 2 days, 2 tabs for 2 days, then 1 tab by mouth daily for 2 days 04/24/21   Delia Heady, PA-C  promethazine (PHENERGAN) 12.5 MG tablet Take 1 tablet (12.5 mg total) by mouth every 6 (six) hours as needed for nausea or vomiting. 12/24/20   Carole Civil, MD  vitamin B-12 (CYANOCOBALAMIN) 500 MCG tablet Take 500 mcg by mouth daily.    [provider]    Allergies    Hydrocodone  Review of Systems   Review of Systems  Musculoskeletal:  Positive for neck pain.  Neurological:  Positive for weakness and numbness.  All other systems reviewed and are negative.  Physical Exam Updated Vital Signs BP (!) 167/89 (BP Location: Left Arm)   Pulse 82   Temp 98.2 F (36.8 C) (Oral)   Resp 16   SpO2 99%   Physical Exam Vitals and nursing note reviewed.  Constitutional:      Appearance: Normal appearance.  HENT:     Head: Normocephalic and atraumatic.     Right Ear: External ear normal.     Left Ear: External ear normal.     Nose: Nose normal.     Mouth/Throat:     Mouth: Mucous membranes are moist.     Pharynx: Oropharynx is clear.  Eyes:     Extraocular Movements: Extraocular movements intact.     Conjunctiva/sclera: Conjunctivae normal.     Pupils: Pupils are equal, round, and reactive to light.  Cardiovascular:     Rate and Rhythm: Normal rate and regular rhythm.     Pulses: Normal pulses.     Heart sounds: Normal heart sounds.  Pulmonary:     Effort: Pulmonary effort is normal.     Breath sounds: Normal breath sounds.  Abdominal:     General: Abdomen is flat. Bowel sounds are normal.     Palpations: Abdomen is soft.  Musculoskeletal:        General: Normal range of motion.     Cervical back: Normal range of motion and neck supple.  Skin:    General: Skin is warm.     Capillary  Refill: Capillary refill takes less than 2 seconds.  Neurological:     Mental Status: He is alert.     Comments: Paresthesias and right arm weakness    ED Results / Procedures / Treatments   Labs (all labs ordered are listed, but only abnormal results are displayed) Labs Reviewed - No data to display  EKG None  Radiology MR Cervical Spine  Wo Contrast  Result Date: 04/27/2021 CLINICAL DATA:  Neck pain for 3 days. No known injury. Myelopathy, acute or progressive. EXAM: MRI CERVICAL SPINE WITHOUT CONTRAST TECHNIQUE: Multiplanar, multisequence MR imaging of the cervical spine was performed. No intravenous contrast was administered. COMPARISON:  Cervical spine CT 04/24/2021 FINDINGS: Alignment: Physiologic. Vertebrae: No acute or suspicious osseous findings. Scattered mild endplate degenerative changes. Cord: Normal in signal and caliber. Posterior Fossa, vertebral arteries, paraspinal tissues: Visualized portions of the posterior fossa appear unremarkable. No significant paraspinal findings. Bilateral vertebral artery flow voids. Disc levels: C2-3: Asymmetric uncinate spurring and facet hypertrophy on the right, contributing to mild to moderate right foraminal narrowing. No cord deformity. C3-4: Asymmetric facet hypertrophy and uncinate spurring on the left, contributing to mild left foraminal narrowing. No cord deformity. C4-5: Minimal uncinate spurring and facet hypertrophy. No spinal stenosis or nerve root encroachment. C5-6: Mild disc bulging and uncinate spurring. Minimal left foraminal narrowing. No cord deformity. C6-7: Spondylosis is greatest at this level with there is loss of disc height with posterior osteophytes covering diffusely bulging disc material. There is asymmetric uncinate spurring on the left, contributing to moderate to severe left foraminal narrowing and possible left C7 nerve root encroachment. No cord deformity. C7-T1: Mild disc bulging and facet hypertrophy. No spinal stenosis  or nerve root encroachment. IMPRESSION: 1. No acute findings or explanation for the patient's symptoms. 2. No cord deformity or abnormal cord signal. 3. Spondylosis as described, greatest at C6-7 where there is moderate to severe left foraminal narrowing. Right foraminal narrowing is greatest at C2-3. Electronically Signed   By: Richardean Sale M.D.   On: 04/27/2021 13:29    Procedures Procedures   Medications Ordered in ED Medications  AeroChamber Plus Flo-Vu Medium MISC 1 each (1 each Other Patient Refused/Not Given 04/27/21 1219)  albuterol (VENTOLIN HFA) 108 (90 Base) MCG/ACT inhaler 2 puff (2 puffs Inhalation Given 04/27/21 1218)  morphine 4 MG/ML injection 4 mg (4 mg Intravenous Given 04/27/21 1214)  ondansetron (ZOFRAN) injection 4 mg (4 mg Intravenous Given 04/27/21 1211)  dexamethasone (DECADRON) injection 10 mg (10 mg Intravenous Given 04/27/21 1216)    ED Course  I have reviewed the triage vital signs and the nursing notes.  Pertinent labs & imaging results that were available during my care of the patient were reviewed by me and considered in my medical decision making (see chart for details).    MDM Rules/Calculators/A&P                           Pt is feeling much better.  Right arm strength has improved with pain relief.  Pt is encouraged to keep his appt.  Return if worse. Final Clinical Impression(s) / ED Diagnoses Final diagnoses:  Cervical radiculopathy    Rx / DC Orders ED Discharge Orders          Ordered    diazepam (VALIUM) 5 MG tablet  2 times daily        04/27/21 1341    HYDROcodone-acetaminophen (NORCO/VICODIN) 5-325 MG tablet  Every 4 hours PRN        04/27/21 1341    ondansetron (ZOFRAN) 4 MG tablet  Every 8 hours PRN        04/27/21 1341             Isla Pence, MD 04/27/21 1343

## 2021-04-30 DIAGNOSIS — M5412 Radiculopathy, cervical region: Secondary | ICD-10-CM | POA: Diagnosis not present

## 2021-05-18 DIAGNOSIS — M542 Cervicalgia: Secondary | ICD-10-CM | POA: Diagnosis not present

## 2021-05-18 DIAGNOSIS — M2569 Stiffness of other specified joint, not elsewhere classified: Secondary | ICD-10-CM | POA: Diagnosis not present

## 2021-05-18 DIAGNOSIS — M5010 Cervical disc disorder with radiculopathy, unspecified cervical region: Secondary | ICD-10-CM | POA: Diagnosis not present

## 2021-05-20 DIAGNOSIS — M5010 Cervical disc disorder with radiculopathy, unspecified cervical region: Secondary | ICD-10-CM | POA: Diagnosis not present

## 2021-05-20 DIAGNOSIS — M2569 Stiffness of other specified joint, not elsewhere classified: Secondary | ICD-10-CM | POA: Diagnosis not present

## 2021-05-20 DIAGNOSIS — M542 Cervicalgia: Secondary | ICD-10-CM | POA: Diagnosis not present

## 2021-07-02 DIAGNOSIS — M5412 Radiculopathy, cervical region: Secondary | ICD-10-CM | POA: Diagnosis not present

## 2021-07-20 IMAGING — RF DG C-ARM 1-60 MIN
1 series · 1 of 1 positions shown · IV contrast (agent unspecified)
Comparison: CT hand 12/23/2020

CLINICAL DATA: Removal of foreign body

EXAM:
DG C-ARM 1-60 MIN; RIGHT HAND - 2 VIEW
CONTRAST:  None
FLUOROSCOPY TIME:  Fluoroscopy Time:  0 minutes 1 second
Images: 1

[Series 1: run · 1 of 1 slices shown]
[im 1/1]
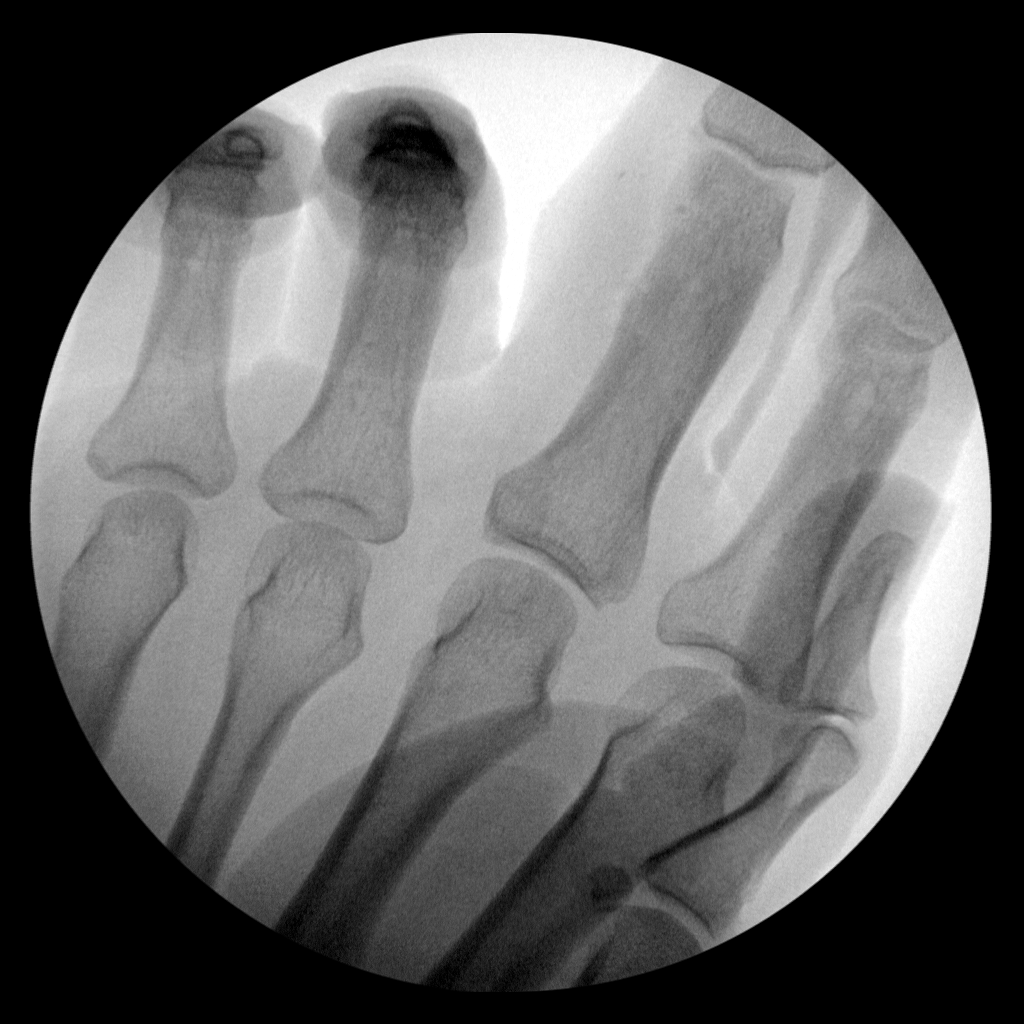

[1 of 1 positions shown; findings below may reference images not displayed]

FINDINGS: BB previously identified at the proximal phalanx of the RIGHT middle
finger is no longer identified.

No fracture or bone destruction.

Single tiny questionable radiopaque foreign body or calcification in
the ulnar soft tissues of the proximal phalanx at the level of the
head, unchanged from CT.
IMPRESSION: Interval removal of BB.

Single tiny retained radiopaque foreign body versus calcification in
the ulnar soft tissues at the proximal phalanx.

## 2021-08-16 DIAGNOSIS — I10 Essential (primary) hypertension: Secondary | ICD-10-CM | POA: Diagnosis not present

## 2021-08-16 DIAGNOSIS — R7301 Impaired fasting glucose: Secondary | ICD-10-CM | POA: Diagnosis not present

## 2021-08-23 DIAGNOSIS — I10 Essential (primary) hypertension: Secondary | ICD-10-CM | POA: Diagnosis not present

## 2021-08-23 DIAGNOSIS — R7301 Impaired fasting glucose: Secondary | ICD-10-CM | POA: Diagnosis not present

## 2021-08-23 DIAGNOSIS — E785 Hyperlipidemia, unspecified: Secondary | ICD-10-CM | POA: Diagnosis not present

## 2021-08-23 DIAGNOSIS — E782 Mixed hyperlipidemia: Secondary | ICD-10-CM | POA: Diagnosis not present

## 2021-09-07 DIAGNOSIS — H35373 Puckering of macula, bilateral: Secondary | ICD-10-CM | POA: Diagnosis not present

## 2021-09-07 DIAGNOSIS — H2513 Age-related nuclear cataract, bilateral: Secondary | ICD-10-CM | POA: Diagnosis not present

## 2021-09-07 DIAGNOSIS — H469 Unspecified optic neuritis: Secondary | ICD-10-CM | POA: Diagnosis not present

## 2022-02-03 ENCOUNTER — Other Ambulatory Visit (HOSPITAL_COMMUNITY): Payer: Self-pay | Admitting: Family Medicine

## 2022-02-03 ENCOUNTER — Ambulatory Visit (HOSPITAL_COMMUNITY)
Admission: RE | Admit: 2022-02-03 | Discharge: 2022-02-03 | Disposition: A | Discharge status: Home / Self Care | Payer: Medicare Other | Source: Ambulatory Visit | Attending: Family Medicine | Admitting: Family Medicine

## 2022-02-03 ENCOUNTER — Other Ambulatory Visit: Payer: Self-pay | Admitting: Family Medicine

## 2022-02-03 DIAGNOSIS — K5900 Constipation, unspecified: Secondary | ICD-10-CM

## 2022-02-03 DIAGNOSIS — R1033 Periumbilical pain: Secondary | ICD-10-CM

## 2022-02-03 DIAGNOSIS — R109 Unspecified abdominal pain: Secondary | ICD-10-CM | POA: Diagnosis not present

## 2022-02-08 ENCOUNTER — Other Ambulatory Visit (HOSPITAL_COMMUNITY): Payer: Self-pay | Admitting: Family Medicine

## 2022-02-08 DIAGNOSIS — K5900 Constipation, unspecified: Secondary | ICD-10-CM

## 2022-02-08 DIAGNOSIS — R1033 Periumbilical pain: Secondary | ICD-10-CM

## 2022-02-16 DIAGNOSIS — R7301 Impaired fasting glucose: Secondary | ICD-10-CM | POA: Diagnosis not present

## 2022-02-16 DIAGNOSIS — E785 Hyperlipidemia, unspecified: Secondary | ICD-10-CM | POA: Diagnosis not present

## 2022-02-16 DIAGNOSIS — I10 Essential (primary) hypertension: Secondary | ICD-10-CM | POA: Diagnosis not present

## 2022-02-22 DIAGNOSIS — Z0001 Encounter for general adult medical examination with abnormal findings: Secondary | ICD-10-CM | POA: Diagnosis not present

## 2022-02-22 DIAGNOSIS — I10 Essential (primary) hypertension: Secondary | ICD-10-CM | POA: Diagnosis not present

## 2022-02-22 DIAGNOSIS — R7301 Impaired fasting glucose: Secondary | ICD-10-CM | POA: Diagnosis not present

## 2022-02-22 DIAGNOSIS — E782 Mixed hyperlipidemia: Secondary | ICD-10-CM | POA: Diagnosis not present

## 2022-02-23 ENCOUNTER — Other Ambulatory Visit: Payer: Self-pay | Admitting: Family Medicine

## 2022-02-23 ENCOUNTER — Other Ambulatory Visit (HOSPITAL_COMMUNITY): Payer: Self-pay | Admitting: Family Medicine

## 2022-02-23 DIAGNOSIS — F17200 Nicotine dependence, unspecified, uncomplicated: Secondary | ICD-10-CM

## 2022-03-16 ENCOUNTER — Other Ambulatory Visit: Payer: Self-pay | Admitting: *Deleted

## 2022-03-16 DIAGNOSIS — K420 Umbilical hernia with obstruction, without gangrene: Secondary | ICD-10-CM

## 2022-03-17 ENCOUNTER — Ambulatory Visit: Payer: Medicare Other | Admitting: General Surgery

## 2022-03-17 ENCOUNTER — Ambulatory Visit (HOSPITAL_COMMUNITY): Payer: Medicare Other

## 2022-03-17 ENCOUNTER — Encounter: Payer: Self-pay | Admitting: General Surgery

## 2022-03-17 ENCOUNTER — Telehealth: Payer: Self-pay | Admitting: *Deleted

## 2022-03-17 ENCOUNTER — Encounter (HOSPITAL_COMMUNITY): Payer: Self-pay

## 2022-03-17 VITALS — BP 143/82 | HR 59 | Temp 97.7°F | Resp 14 | Ht 68.0 in | Wt 211.0 lb

## 2022-03-17 DIAGNOSIS — K429 Umbilical hernia without obstruction or gangrene: Secondary | ICD-10-CM | POA: Insufficient documentation

## 2022-03-17 DIAGNOSIS — R590 Localized enlarged lymph nodes: Secondary | ICD-10-CM

## 2022-03-17 NOTE — Telephone Encounter (Signed)
Patient noted to have CT orders for abdomen/ pelvis and chest/ lung ordered from Valentino Nose, NP.  Noted that imaging has not been scheduled at this time.   Per Dr. Constance Haw, CT ABD/ pelvis is required prior to scheduling surgery.  Call placed to Dr. Juel Burrow office and discussed with Gabriel Cirri, referral coordinator. Advised that CT ABD/ pelvis does not require authorization. Advised that CT chest has been approved through 03/23/2022, authorization: [id number]  Gabriel Cirri will call to extend authorization so that patient may get both images done at the same time.   CT ABD/ Pelvis scheduled for 04/20/2022.

## 2022-03-17 NOTE — Progress Notes (Incomplete)
[facility] History and Physical  Reason for Referral:*** Referring Physician: ***  Chief Complaint   New Patient (Initial Visit)     Frank White is a 61 y.o. male.  HPI: ***.  The *** started *** and has had a duration of ***.  It is associated with ***.  The *** is improved with ***, and is made worse with ***.    Quality*** Context***  Past Medical History:  Diagnosis Date  . Hypertension     Past Surgical History:  Procedure Laterality Date  . AMPUTATION  09/07/2012   Procedure: AMPUTATION DIGIT;  Surgeon: Carole Civil, MD;  Location: [facility];  Service: Orthopedics;  Laterality: Left;  left long finger - revision of amputation   . APPENDECTOMY    . COLONOSCOPY N/A 08/08/2018   Procedure: COLONOSCOPY;  Surgeon: Rogene Houston, MD;  Location: [facility];  Service: Endoscopy;  Laterality: N/A;  8:30  . INCISION AND DRAINAGE ABSCESS Right 12/29/2020   Procedure: INCISION AND DRAINAGE GRANULOMA RIGHT MIDDLE FINGER AND FOREIGN BODY REMOVAL;  Surgeon: Carole Civil, MD;  Location: [facility];  Service: Orthopedics;  Laterality: Right;  . POLYPECTOMY  08/08/2018   Procedure: POLYPECTOMY;  Surgeon: Rogene Houston, MD;  Location: [facility];  Service: Endoscopy;;  colon   . TONSILLECTOMY      Family History  Problem Relation Age of Onset  . Diabetes Other     Social History   Tobacco Use  . Smoking status: Every Day    Packs/day: 1.00    Types: Cigarettes  . Smokeless tobacco: Never  Vaping Use  . Vaping Use: Never used  Substance Use Topics  . Alcohol use: No  . Drug use: No    Medications: {medication reviewed/display:3041432} Allergies as of 03/17/2022       Reactions   Hydrocodone Nausea Only        Medication List        Accurate as of March 17, 2022  2:13 PM. If you have any questions, ask your nurse or doctor.          acetaminophen 325 MG tablet Commonly known as: TYLENOL Take 650 mg by mouth daily as  needed for moderate pain or headache.   amLODipine 2.5 MG tablet Commonly known as: NORVASC Take 2.5 mg by mouth 3 (three) times a week.   butalbital-acetaminophen-caffeine 50-325-40 MG tablet Commonly known as: FIORICET Take 1 tablet by mouth 2 (two) times daily as needed for headache.   diazepam 5 MG tablet Commonly known as: VALIUM Take 1 tablet (5 mg total) by mouth 2 (two) times daily.   HYDROcodone-acetaminophen 5-325 MG tablet Commonly known as: NORCO/VICODIN Take 1 tablet by mouth every 4 (four) hours as needed.   ibuprofen 800 MG tablet Commonly known as: ADVIL Take 1 tablet (800 mg total) by mouth every 8 (eight) hours as needed. What changed: reasons to take this   lisinopril-hydrochlorothiazide 20-25 MG tablet Commonly known as: ZESTORETIC Take 1 tablet by mouth daily.   LUBRICATING EYE DROPS OP Place 1 drop into both eyes daily as needed (dry eyes).   methocarbamol 500 MG tablet Commonly known as: ROBAXIN Take 1 tablet (500 mg total) by mouth 3 (three) times daily.   ondansetron 4 MG tablet Commonly known as: ZOFRAN Take 1 tablet (4 mg total) by mouth every 8 (eight) hours as needed for nausea or vomiting.   predniSONE 10 MG (21) Tbpk tablet Commonly known as: STERAPRED UNI-PAK  21 TAB Take by mouth daily. Take 6 tabs by mouth daily  for 2 days, then 5 tabs for 2 days, then 4 tabs for 2 days, then 3 tabs for 2 days, 2 tabs for 2 days, then 1 tab by mouth daily for 2 days   promethazine 12.5 MG tablet Commonly known as: PHENERGAN Take 1 tablet (12.5 mg total) by mouth every 6 (six) hours as needed for nausea or vomiting.   vitamin B-12 500 MCG tablet Commonly known as: CYANOCOBALAMIN Take 500 mcg by mouth daily.   Vitamin D 50 MCG (2000 UT) tablet Take 2,000 Units by mouth 3 (three) times a week.         ROS:  {Review of Systems:30496}  Blood pressure (!) 143/82, pulse (!) 59, temperature 97.7 F (36.5 C), temperature source Oral, resp. rate  14, height '5\' 8"'$  (1.727 m), weight 211 lb (95.7 kg), SpO2 97 %. Physical Exam  Results: No results found for this or any previous visit (from the past 48 hour(s)).  No results found.   Assessment & Plan:  Frank White is a 61 y.o. male with *** -*** -*** -Follow up ***  All questions were answered to the satisfaction of the patient and family***.  The risk and benefits of *** were discussed including but not limited to ***.  After careful consideration, Frank White has decided to ***.    Virl Cagey 03/17/2022, 2:13 PM

## 2022-03-17 NOTE — Patient Instructions (Addendum)
Will get the CT a/p and chest and we are calling Dr. Juel Burrow office to see if and when they are getting these approved and scheduled. It will likely be mid July at [facility] before proceeding with surgery.  Once we have this will get the surgery scheduled.  Open Hernia Repair, Adult Open hernia repair is a surgical procedure to fix a hernia. A hernia occurs when an internal organ or tissue pushes through a weak spot in the muscles along the wall of the abdomen. Hernias commonly occur in the groin and around the belly button. Most hernias tend to get worse over time. Often, surgery is done to prevent the hernia from becoming bigger, uncomfortable, or an emergency. Emergency surgery may be needed if contents of the abdomen get stuck in the opening (incarcerated hernia) or if the blood supply gets cut off (strangulated hernia). In an open repair, an incision is made in the abdomen to perform the surgery. Tell a health care provider about: Any allergies you have. All medicines you are taking, including vitamins, herbs, eye drops, creams, and over-the-counter medicines. Any problems you or family members have had with anesthetic medicines. Any blood or bone disorders you have. Any surgeries you have had. Any medical conditions you have, including any recent cold or flu (influenza)symptoms. Whether you are pregnant or may be pregnant. What are the risks? Generally, this is a safe procedure. However, problems may occur, including: Long-lasting (chronic) pain. Bleeding. Infection. Damage to the testicles. This can cause shrinking or swelling. Damage to nearby structures or organs, including the bladder, blood vessels, intestines, or nerves near the hernia. Blood clots. Trouble passing urine. Return of the hernia. What happens before the procedure? Medicines Ask your health care provider about: Changing or stopping your regular medicines. This is especially important if you are taking diabetes  medicines or blood thinners. Taking medicines such as aspirin and ibuprofen. These medicines can thin your blood. Do not take these medicines unless your health care provider tells you to take them. Taking over-the-counter medicines, vitamins, herbs, and supplements. Surgery safety Ask your health care provider: How your surgery site will be marked. What steps will be taken to help prevent infection. These steps may include: Removing hair at the surgery site. Washing skin with a germ-killing soap. Receiving antibiotic medicine. General instructions You may have an exam or testing, such as blood tests or imaging studies. Do not use any products that contain nicotine or tobacco for at least 4 weeks before the procedure. These products include cigarettes, chewing tobacco, and vaping devices, such as e-cigarettes. If you need help quitting, ask your health care provider. Let your health care provider know if you develop a cold or any infection before your surgery. If you get an infection before surgery, you may receive antibiotics to treat it. Plan to have a responsible adult take you home from the hospital or clinic. If you will be going home right after the procedure, plan to have a responsible adult care for you for the time you are told. This is important. What happens during the procedure?  An IV will be inserted into one of your veins. You will be given one or more of the following: A medicine to help you relax (sedative). A medicine to numb the area (local anesthetic). A medicine to make you fall asleep (general anesthetic). Your surgeon will make an incision over the hernia. The tissues of the hernia will be moved back into place. The edges of the hernia  may be stitched (sutured) together. The opening in the abdominal muscles will be closed with stitches (sutures). Or, your surgeon will place a mesh patch made of artificial (synthetic) material over the opening. The incision will be  closed with sutures, skin glue, or adhesive strips. A bandage (dressing) may be placed over the incision. The procedure may vary among health care providers and hospitals. What happens after the procedure? Your blood pressure, heart rate, breathing rate, and blood oxygen level will be monitored until you leave the hospital or clinic. You may be given medicine for pain. If you were given a sedative during the procedure, it can affect you for several hours. Do not drive or operate machinery until your health care provider says that it is safe. Summary Open hernia repair is a surgical procedure to fix a hernia. Hernias commonly occur in the groin and around the belly button. Emergency surgery may be needed if contents of the abdomen get stuck in the opening (incarcerated hernia) or if the blood supply gets cut off (strangulated hernia). In this procedure, an incision is made in the abdomen to perform the surgery. After the procedure, you may be given medicine for pain. This information is not intended to replace advice given to you by your health care provider. Make sure you discuss any questions you have with your health care provider. Document Revised: 04/27/2020 Document Reviewed: 04/27/2020 Elsevier Patient Education  [place].  Umbilical Hernia, Adult  A hernia is a bulge of tissue that pushes through an opening between muscles. An umbilical hernia happens in the abdomen, near the belly button (umbilicus). The hernia may contain tissues from the small intestine, large intestine, or fatty tissue covering the intestines. Umbilical hernias in adults tend to get worse over time, and they require surgical treatment. There are different types of umbilical hernias, including: Indirect hernia. This type is located just above or below the umbilicus. It is the most common type of umbilical hernia in adults. Direct hernia. This type forms through an opening formed by the umbilicus. Reducible  hernia. This type of hernia comes and goes. It may be visible only when you strain, lift something heavy, or cough. This type of hernia can be pushed back into the abdomen (reduced). Incarcerated hernia. This type traps abdominal tissue inside the hernia. This type of hernia cannot be reduced. Strangulated hernia. This type of hernia cuts off blood flow to the tissues inside the hernia. The tissues can start to die if this happens. This type of hernia requires emergency treatment. What are the causes? An umbilical hernia happens when tissue inside the abdomen presses on a weak area of the abdominal muscles. What increases the risk? You may have a greater risk of this condition if you: Are obese. Have had several pregnancies. Have a buildup of fluid inside your abdomen. Have had surgery that weakens the abdominal muscles. What are the signs or symptoms? The main symptom of this condition is a painless bulge at or near the belly button. A reducible hernia may be visible only when you strain, lift something heavy, or cough. Other symptoms may include: Dull pain. A feeling of pressure. Symptoms of a strangulated hernia may include: Pain that gets increasingly worse. Nausea and vomiting. Pain when pressing on the hernia. Skin over the hernia becoming red or purple. Constipation. Blood in the stool. How is this diagnosed? This condition may be diagnosed based on: A physical exam. You may be asked to cough or strain while standing.  These actions increase the pressure inside your abdomen and can force the hernia through the opening in your muscles. Your health care provider may try to reduce the hernia by pressing on it. Your symptoms and medical history. How is this treated? Surgery is the only treatment for an umbilical hernia. Surgery for a strangulated hernia is done as soon as possible. If you have a small hernia that is not incarcerated, you may need to lose weight before having  surgery. Follow these instructions at home: Lose weight, if told by your health care provider. Do not try to push the hernia back in. Watch your hernia for any changes in color or size. Tell your health care provider if any changes occur. You may need to avoid activities that increase pressure on your hernia. Do not lift anything that is heavier than 10 lb (4.5 kg), or the limit that you are told, until your health care provider says that it is safe. Take over-the-counter and prescription medicines only as told by your health care provider. Keep all follow-up visits. This is important. Contact a health care provider if: Your hernia gets larger. Your hernia becomes painful. Get help right away if: You develop sudden, severe pain near the area of your hernia. You have pain as well as nausea or vomiting. You have pain and the skin over your hernia changes color. You develop a fever or chills. Summary A hernia is a bulge of tissue that pushes through an opening between muscles. An umbilical hernia happens near the belly button. Surgery is the only treatment for an umbilical hernia. Do not try to push your hernia back in. Keep all follow-up visits. This is important. This information is not intended to replace advice given to you by your health care provider. Make sure you discuss any questions you have with your health care provider. Document Revised: 04/20/2020 Document Reviewed: 04/20/2020 Elsevier Patient Education  [place].

## 2022-03-21 NOTE — Telephone Encounter (Signed)
Call placed to Dr. Scharlene Gloss office and spoke with [place]. Was advised that authorization for CT Lung is pending.

## 2022-03-30 NOTE — Telephone Encounter (Signed)
CT Lung approved and scheduled for same day.

## 2022-04-04 NOTE — Telephone Encounter (Signed)
Received call from patient wife, Frank White [phone] telephone.   Reports that she was calling to inquire if CT Abd/ Pelvis had been authorized.   Advised of previous message where Dr. Juel Burrow office advised that authorization was not required.   Call placed to [place] where I was advised that all imaging requires authorization.   Call placed to [facility], referral coordinator at Dr. Juel Burrow office. Advised that authorization [id number] approved through 04/14/2022. Advised that CT is scheduled for 04/20/2022 and authorization will need to be extended.   Verbalized understanding and will contact insurance.

## 2022-04-20 ENCOUNTER — Ambulatory Visit (HOSPITAL_COMMUNITY)
Admission: RE | Admit: 2022-04-20 | Discharge: 2022-04-20 | Disposition: A | Discharge status: Home / Self Care | Payer: Medicare Other | Source: Ambulatory Visit | Attending: Family Medicine | Admitting: Family Medicine

## 2022-04-20 DIAGNOSIS — J439 Emphysema, unspecified: Secondary | ICD-10-CM | POA: Insufficient documentation

## 2022-04-20 DIAGNOSIS — K5909 Other constipation: Secondary | ICD-10-CM | POA: Diagnosis not present

## 2022-04-20 DIAGNOSIS — I700 Atherosclerosis of aorta: Secondary | ICD-10-CM | POA: Diagnosis not present

## 2022-04-20 DIAGNOSIS — F17200 Nicotine dependence, unspecified, uncomplicated: Secondary | ICD-10-CM | POA: Insufficient documentation

## 2022-04-20 DIAGNOSIS — K760 Fatty (change of) liver, not elsewhere classified: Secondary | ICD-10-CM | POA: Insufficient documentation

## 2022-04-20 DIAGNOSIS — Z122 Encounter for screening for malignant neoplasm of respiratory organs: Secondary | ICD-10-CM | POA: Insufficient documentation

## 2022-04-20 DIAGNOSIS — K5900 Constipation, unspecified: Secondary | ICD-10-CM | POA: Diagnosis not present

## 2022-04-20 DIAGNOSIS — R1033 Periumbilical pain: Secondary | ICD-10-CM | POA: Insufficient documentation

## 2022-04-20 DIAGNOSIS — F17210 Nicotine dependence, cigarettes, uncomplicated: Secondary | ICD-10-CM | POA: Diagnosis not present

## 2022-04-20 DIAGNOSIS — I2510 Atherosclerotic heart disease of native coronary artery without angina pectoris: Secondary | ICD-10-CM | POA: Diagnosis not present

## 2022-04-20 MED ORDER — IOHEXOL 300 MG/ML  SOLN
100.0000 mL | Freq: Once | INTRAMUSCULAR | Status: AC | PRN
  Administered 2022-04-20: 100 mL via INTRAVENOUS

## 2022-04-25 ENCOUNTER — Ambulatory Visit: Payer: Medicare Other | Admitting: General Surgery

## 2022-04-25 ENCOUNTER — Encounter: Payer: Self-pay | Admitting: General Surgery

## 2022-04-25 VITALS — BP 164/92 | HR 59 | Temp 97.9°F | Resp 16 | Ht 68.0 in | Wt 207.0 lb

## 2022-04-25 DIAGNOSIS — K429 Umbilical hernia without obstruction or gangrene: Secondary | ICD-10-CM | POA: Diagnosis not present

## 2022-04-25 NOTE — Progress Notes (Signed)
[facility] History and Physical    Chief Complaint   Follow-up     Frank White is a 61 y.o. male.  HPI: Patient with an umbilical hernia that is causing him issues. He saw me over a month ago and was getting a repeat CT due to some adenopathy he had on prior CT. He was also getting a CT chest. These are done and nothing concerning on either regarding any cancers or adenopathy. His hernia defect is now 1cm with fat.  He continues to have pain especially with palpation. He denies any obstructive symptoms.   Past Medical History:  Diagnosis Date   Hypertension     Past Surgical History:  Procedure Laterality Date   AMPUTATION  09/07/2012   Procedure: AMPUTATION DIGIT;  Surgeon: Carole Civil, MD;  Location: [facility];  Service: Orthopedics;  Laterality: Left;  left long finger - revision of amputation    APPENDECTOMY     COLONOSCOPY N/A 08/08/2018   Procedure: COLONOSCOPY;  Surgeon: Rogene Houston, MD;  Location: [facility];  Service: Endoscopy;  Laterality: N/A;  8:30   INCISION AND DRAINAGE ABSCESS Right 12/29/2020   Procedure: INCISION AND DRAINAGE GRANULOMA RIGHT MIDDLE FINGER AND FOREIGN BODY REMOVAL;  Surgeon: Carole Civil, MD;  Location: [facility];  Service: Orthopedics;  Laterality: Right;   POLYPECTOMY  08/08/2018   Procedure: POLYPECTOMY;  Surgeon: Rogene Houston, MD;  Location: [facility];  Service: Endoscopy;;  colon    TONSILLECTOMY      Family History  Problem Relation Age of Onset   Diabetes Other     Social History   Tobacco Use   Smoking status: Every Day    Packs/day: 1.00    Types: Cigarettes   Smokeless tobacco: Never  Vaping Use   Vaping Use: Never used  Substance Use Topics   Alcohol use: No   Drug use: No    Medications: I have reviewed the patient's current medications. Allergies as of 04/25/2022       Reactions   Hydrocodone Nausea Only        Medication List        Accurate as of April 25, 2022  12:18 PM. If you have any questions, ask your nurse or doctor.          STOP taking these medications    diazepam 5 MG tablet Commonly known as: VALIUM Stopped by: Virl Cagey, MD   HYDROcodone-acetaminophen 5-325 MG tablet Commonly known as: NORCO/VICODIN Stopped by: Virl Cagey, MD   ibuprofen 800 MG tablet Commonly known as: ADVIL Stopped by: Virl Cagey, MD   methocarbamol 500 MG tablet Commonly known as: ROBAXIN Stopped by: Virl Cagey, MD   ondansetron 4 MG tablet Commonly known as: ZOFRAN Stopped by: Virl Cagey, MD   predniSONE 10 MG (21) Tbpk tablet Commonly known as: STERAPRED UNI-PAK 21 TAB Stopped by: Virl Cagey, MD   promethazine 12.5 MG tablet Commonly known as: PHENERGAN Stopped by: Virl Cagey, MD   Vitamin D 50 MCG (2000 UT) tablet Stopped by: Virl Cagey, MD       TAKE these medications    acetaminophen 325 MG tablet Commonly known as: TYLENOL Take 650 mg by mouth daily as needed for moderate pain or headache.   amLODipine 2.5 MG tablet Commonly known as: NORVASC Take 2.5 mg by mouth 3 (three) times a week.   butalbital-acetaminophen-caffeine 50-325-40 MG tablet Commonly known as:  FIORICET Take 1 tablet by mouth 2 (two) times daily as needed for headache.   KRILL OIL PO Take by mouth.   lisinopril-hydrochlorothiazide 20-25 MG tablet Commonly known as: ZESTORETIC Take 1 tablet by mouth daily.   LUBRICATING EYE DROPS OP Place 1 drop into both eyes daily as needed (dry eyes).   vitamin B-12 500 MCG tablet Commonly known as: CYANOCOBALAMIN Take 500 mcg by mouth daily.         ROS:  A comprehensive review of systems was negative except for: Gastrointestinal: positive for abdominal pain  Blood pressure (!) 164/92, pulse (!) 59, temperature 97.9 F (36.6 C), temperature source Oral, resp. rate 16, height '5\' 8"'$  (1.727 m), weight 207 lb (93.9 kg), SpO2 95 %. Physical Exam Vitals  reviewed.  HENT:     Head: Normocephalic.     Nose: Nose normal.  Eyes:     Extraocular Movements: Extraocular movements intact.  Cardiovascular:     Rate and Rhythm: Normal rate and regular rhythm.  Pulmonary:     Effort: Pulmonary effort is normal.  Abdominal:     General: There is no distension.     Palpations: Abdomen is soft.     Tenderness: There is abdominal tenderness.     Hernia: A hernia is present.  Musculoskeletal:        General: Normal range of motion.     Cervical back: Normal range of motion.  Skin:    General: Skin is warm.  Neurological:     General: No focal deficit present.     Mental Status: He is alert.  Psychiatric:        Mood and Affect: Mood normal.        Thought Content: Thought content normal.        Judgment: Judgment normal.     Results: Personally reviewed- CT abd- 1cm umbilical defect with fat CLINICAL DATA:  Chronic constipation.  Prior appendectomy.   EXAM: CT ABDOMEN AND PELVIS WITH CONTRAST   TECHNIQUE: Multidetector CT imaging of the abdomen and pelvis was performed using the standard protocol following bolus administration of intravenous contrast.   RADIATION DOSE REDUCTION: This exam was performed according to the departmental dose-optimization program which includes automated exposure control, adjustment of the mA and/or kV according to patient size and/or use of iterative reconstruction technique.   CONTRAST:  159m OMNIPAQUE IOHEXOL 300 MG/ML  SOLN   COMPARISON:  Plain film of 02/03/2022.  Most recent CT of 12/14/2018   FINDINGS: Lower chest: Clear lung bases. Normal heart size without pericardial or pleural effusion.   Hepatobiliary: Mild hepatic steatosis, without focal liver lesion. Normal gallbladder, without biliary ductal dilatation.   Pancreas: Normal, without mass or ductal dilatation.   Spleen: Normal in size, without focal abnormality.   Adrenals/Urinary Tract: Minimal left adrenal nodularity.  Normal right adrenal gland. Too small to characterize right renal lesions. Normal left kidney. Perinephric interstitial thickening is chronic, greater left than right. Apparent nodularity within the left perinephric space medially on 30/2 and coronal image 85 is new since 2020. No hydronephrosis. Normal urinary bladder.   Stomach/Bowel: Normal stomach, without wall thickening. Moderate amount of colonic stool. No obstructive mass, evidence of fecal impaction or colitis.   Normal terminal ileum.  Appendectomy. Normal small bowel.   Vascular/Lymphatic: Aortic atherosclerosis. No abdominopelvic adenopathy.   Reproductive: Normal prostate.   Other: No significant free fluid. No free intraperitoneal air. Small fat containing paraumbilical hernia.   Musculoskeletal: Mild disc bulges at L3-4 through  L5-S1.   IMPRESSION: 1. No acute process in the abdomen or pelvis. No explanation for constipation. Moderate stool burden. 2. Left-greater than right perinephric interstitial thickening is nonspecific, can be seen with renal insufficiency. Suggestion of developing perinephric nodularity which is also favored to be of a similar etiology. Consider abdominal CT follow-up at 6 months to exclude unlikely alternate etiologies such as lymphoproliferative process. 3. Hepatic steatosis. 4. Aortic Atherosclerosis (ICD10-I70.0).     Electronically Signed   By: Abigail Miyamoto M.D.   On: 04/20/2022 11:54    Assessment & Plan:  Frank White is a 61 y.o. male with an umbilical hernia. He also has some constipation. The hernia only has fat in it. Discussed repair and risk of bleeding, infection, recurrence, use of mesh.   Plan for OR 8/18 Metamucil for constipation   All questions were answered to the satisfaction of the patient and family.     Virl Cagey 04/25/2022, 12:18 PM

## 2022-04-25 NOTE — Patient Instructions (Addendum)
Start taking metamucil or benefiber to help with constipation before surgery.  Open Hernia Repair, Adult Open hernia repair is a surgical procedure to fix a hernia. A hernia occurs when an internal organ or tissue pushes through a weak spot in the muscles along the wall of the abdomen. Hernias commonly occur in the groin and around the belly button. Most hernias tend to get worse over time. Often, surgery is done to prevent the hernia from becoming bigger, uncomfortable, or an emergency. Emergency surgery may be needed if contents of the abdomen get stuck in the opening (incarcerated hernia) or if the blood supply gets cut off (strangulated hernia). In an open repair, an incision is made in the abdomen to perform the surgery. Tell a health care provider about: Any allergies you have. All medicines you are taking, including vitamins, herbs, eye drops, creams, and over-the-counter medicines. Any problems you or family members have had with anesthetic medicines. Any blood or bone disorders you have. Any surgeries you have had. Any medical conditions you have, including any recent cold or flu (influenza)symptoms. Whether you are pregnant or may be pregnant. What are the risks? Generally, this is a safe procedure. However, problems may occur, including: Long-lasting (chronic) pain. Bleeding. Infection. Damage to the testicles. This can cause shrinking or swelling. Damage to nearby structures or organs, including the bladder, blood vessels, intestines, or nerves near the hernia. Blood clots. Trouble passing urine. Return of the hernia. What happens before the procedure?  Medicines Ask your health care provider about: Changing or stopping your regular medicines. This is especially important if you are taking diabetes medicines or blood thinners. Taking medicines such as aspirin and ibuprofen. These medicines can thin your blood. Do not take these medicines unless your health care provider tells  you to take them. Taking over-the-counter medicines, vitamins, herbs, and supplements. Surgery safety Ask your health care provider: How your surgery site will be marked. What steps will be taken to help prevent infection. These steps may include: Removing hair at the surgery site. Washing skin with a germ-killing soap. Receiving antibiotic medicine. General instructions You may have an exam or testing, such as blood tests or imaging studies. Do not use any products that contain nicotine or tobacco for at least 4 weeks before the procedure. These products include cigarettes, chewing tobacco, and vaping devices, such as e-cigarettes. If you need help quitting, ask your health care provider. Let your health care provider know if you develop a cold or any infection before your surgery. If you get an infection before surgery, you may receive antibiotics to treat it. Plan to have a responsible adult take you home from the hospital or clinic. If you will be going home right after the procedure, plan to have a responsible adult care for you for the time you are told. This is important. What happens during the procedure?  An IV will be inserted into one of your veins. You will be given one or more of the following: A medicine to help you relax (sedative). A medicine to numb the area (local anesthetic). A medicine to make you fall asleep (general anesthetic). Your surgeon will make an incision over the hernia. The tissues of the hernia will be moved back into place. The edges of the hernia may be stitched (sutured) together. The opening in the abdominal muscles will be closed with stitches (sutures). Or, your surgeon will place a mesh patch made of artificial (synthetic) material over the opening. The incision will be  closed with sutures, skin glue, or adhesive strips. A bandage (dressing) may be placed over the incision. The procedure may vary among health care providers and hospitals. What  happens after the procedure? Your blood pressure, heart rate, breathing rate, and blood oxygen level will be monitored until you leave the hospital or clinic. You may be given medicine for pain. If you were given a sedative during the procedure, it can affect you for several hours. Do not drive or operate machinery until your health care provider says that it is safe. Summary Open hernia repair is a surgical procedure to fix a hernia. Hernias commonly occur in the groin and around the belly button. Emergency surgery may be needed if contents of the abdomen get stuck in the opening (incarcerated hernia) or if the blood supply gets cut off (strangulated hernia). In this procedure, an incision is made in the abdomen to perform the surgery. After the procedure, you may be given medicine for pain. This information is not intended to replace advice given to you by your health care provider. Make sure you discuss any questions you have with your health care provider. Document Revised: 04/27/2020 Document Reviewed: 04/27/2020 Elsevier Patient Education  [place].  Ventral Hernia  A ventral hernia is a bulge of tissue from inside the abdomen that pushes through a weak area of the muscles that form the front wall of the abdomen. The tissues inside the abdomen are inside a sac (peritoneum). These tissues include the small intestine, large intestine, and the fatty tissue that covers the intestines (omentum). Sometimes, the bulge that forms a hernia contains intestines. Other hernias contain only fat. Ventral hernias do not go away without surgical treatment. There are several types of ventral hernias. You may have: A hernia at an incision site from previous abdominal surgery (incisional hernia). A hernia just above the belly button (epigastric hernia), or at the belly button (umbilical hernia). These types of hernias can develop from heavy lifting or straining. A hernia that comes and goes  (reducible hernia). It may be visible only when you lift or strain. This type of hernia can be pushed back into the abdomen (reduced). A hernia that traps abdominal tissue inside the hernia (incarcerated hernia). This type of hernia does not reduce. A hernia that cuts off blood flow to the tissues inside the hernia (strangulated hernia). The tissues can start to die if this happens. This is a very painful bulge that cannot be reduced. A strangulated hernia is a medical emergency. What are the causes? This condition is caused by abdominal tissue putting pressure on an area of weakness in the abdominal muscles. What increases the risk? The following factors may make you more likely to develop this condition: Being age 21 or older. Being overweight or obese. Having had previous abdominal surgery, especially if there was an infection after surgery. Having had an injury to the abdominal wall. Frequently lifting or pushing heavy objects. Having had several pregnancies. Having a buildup of fluid inside the abdomen (ascites). Straining to have a bowel movement or to urinate. Having frequent coughing episodes. What are the signs or symptoms? The only symptom of a ventral hernia may be a painless bulge in the abdomen. A reducible hernia may be visible only when you strain, cough, or lift. Other symptoms may include: Dull pain. A feeling of pressure. Signs and symptoms of a strangulated hernia may include: Increasing pain. Nausea and vomiting. Pain when pressing on the hernia. The skin over the hernia  turning red or purple. Constipation. Blood in the stool (feces). How is this diagnosed? This condition may be diagnosed based on: Your symptoms. Your medical history. A physical exam. You may be asked to cough or strain while standing. These actions increase the pressure inside your abdomen and force the hernia through the opening in your muscles. Your health care provider may try to reduce the  hernia by gently pushing the hernia back in. Imaging studies, such as an ultrasound or CT scan. How is this treated? This condition is treated with surgery. If you have a strangulated hernia, surgery is done as soon as possible. If your hernia is small and not incarcerated, you may be asked to lose some weight before surgery. Follow these instructions at home: Follow instructions from your health care provider about eating or drinking restrictions. If you are overweight, your health care provider may recommend that you increase your activity level and eat a healthier diet. Do not lift anything that is heavier than 10 lb (4.5 kg), or the limit that you are told, until your health care provider says that it is safe. Return to your normal activities as told by your health care provider. Ask your health care provider what activities are safe for you. You may need to avoid activities that increase pressure on your hernia. Take over-the-counter and prescription medicines only as told by your health care provider. Keep all follow-up visits. This is important. Contact a health care provider if: Your hernia gets larger. Your hernia becomes painful. Get help right away if: Your hernia becomes increasingly painful. You have pain along with any of the following: Changes in skin color in the area of the hernia. Nausea. Vomiting. Fever. These symptoms may represent a serious problem that is an emergency. Do not wait to see if the symptoms will go away. Get medical help right away. Call your local emergency services (911 in the [place] Do not drive yourself to the hospital. Summary A ventral hernia is a bulge of tissue from inside the abdomen that pushes through a weak area of the muscles that form the front wall of the abdomen. This condition is treated with surgery, which may be urgent depending on your hernia. Do not lift anything that is heavier than 10 lb (4.5 kg), and follow activity instructions from  your health care provider. This information is not intended to replace advice given to you by your health care provider. Make sure you discuss any questions you have with your health care provider. Document Revised: 05/01/2020 Document Reviewed: 05/01/2020 Elsevier Patient Education  [place].

## 2022-04-27 NOTE — H&P (Signed)
[facility] History and Physical       Chief Complaint   Follow-up        Frank White is a 61 y.o. male.  HPI: Patient with an umbilical hernia that is causing him issues. He saw me over a month ago and was getting a repeat CT due to some adenopathy he had on prior CT. He was also getting a CT chest. These are done and nothing concerning on either regarding any cancers or adenopathy. His hernia defect is now 1cm with fat.  He continues to have pain especially with palpation. He denies any obstructive symptoms.        Past Medical History:  Diagnosis Date   Hypertension             Past Surgical History:  Procedure Laterality Date   AMPUTATION   09/07/2012    Procedure: AMPUTATION DIGIT;  Surgeon: Carole Civil, MD;  Location: [facility];  Service: Orthopedics;  Laterality: Left;  left long finger - revision of amputation    APPENDECTOMY       COLONOSCOPY N/A 08/08/2018    Procedure: COLONOSCOPY;  Surgeon: Rogene Houston, MD;  Location: [facility];  Service: Endoscopy;  Laterality: N/A;  8:30   INCISION AND DRAINAGE ABSCESS Right 12/29/2020    Procedure: INCISION AND DRAINAGE GRANULOMA RIGHT MIDDLE FINGER AND FOREIGN BODY REMOVAL;  Surgeon: Carole Civil, MD;  Location: [facility];  Service: Orthopedics;  Laterality: Right;   POLYPECTOMY   08/08/2018    Procedure: POLYPECTOMY;  Surgeon: Rogene Houston, MD;  Location: [facility];  Service: Endoscopy;;  colon     TONSILLECTOMY               Family History  Problem Relation Age of Onset   Diabetes Other        Social History         Tobacco Use   Smoking status: Every Day      Packs/day: 1.00      Types: Cigarettes   Smokeless tobacco: Never  Vaping Use   Vaping Use: Never used  Substance Use Topics   Alcohol use: No   Drug use: No      Medications: I have reviewed the patient's current medications. Allergies as of 04/25/2022         Reactions    Hydrocodone Nausea Only             Medication List           Accurate as of April 25, 2022 12:18 PM. If you have any questions, ask your nurse or doctor.              STOP taking these medications     diazepam 5 MG tablet Commonly known as: VALIUM Stopped by: Virl Cagey, MD    HYDROcodone-acetaminophen 5-325 MG tablet Commonly known as: NORCO/VICODIN Stopped by: Virl Cagey, MD    ibuprofen 800 MG tablet Commonly known as: ADVIL Stopped by: Virl Cagey, MD    methocarbamol 500 MG tablet Commonly known as: ROBAXIN Stopped by: Virl Cagey, MD    ondansetron 4 MG tablet Commonly known as: ZOFRAN Stopped by: Virl Cagey, MD    predniSONE 10 MG (21) Tbpk tablet Commonly known as: STERAPRED UNI-PAK 21 TAB Stopped by: Virl Cagey, MD    promethazine 12.5 MG tablet Commonly known as: PHENERGAN Stopped by: Virl Cagey, MD    Vitamin  D 50 MCG (2000 UT) tablet Stopped by: Virl Cagey, MD           TAKE these medications     acetaminophen 325 MG tablet Commonly known as: TYLENOL Take 650 mg by mouth daily as needed for moderate pain or headache.    amLODipine 2.5 MG tablet Commonly known as: NORVASC Take 2.5 mg by mouth 3 (three) times a week.    butalbital-acetaminophen-caffeine 50-325-40 MG tablet Commonly known as: FIORICET Take 1 tablet by mouth 2 (two) times daily as needed for headache.    KRILL OIL PO Take by mouth.    lisinopril-hydrochlorothiazide 20-25 MG tablet Commonly known as: ZESTORETIC Take 1 tablet by mouth daily.    LUBRICATING EYE DROPS OP Place 1 drop into both eyes daily as needed (dry eyes).    vitamin B-12 500 MCG tablet Commonly known as: CYANOCOBALAMIN Take 500 mcg by mouth daily.               ROS:  A comprehensive review of systems was negative except for: Gastrointestinal: positive for abdominal pain   Blood pressure (!) 164/92, pulse (!) 59, temperature 97.9 F (36.6 C), temperature source Oral,  resp. rate 16, height '5\' 8"'$  (1.727 m), weight 207 lb (93.9 kg), SpO2 95 %. Physical Exam Vitals reviewed.  HENT:     Head: Normocephalic.     Nose: Nose normal.  Eyes:     Extraocular Movements: Extraocular movements intact.  Cardiovascular:     Rate and Rhythm: Normal rate and regular rhythm.  Pulmonary:     Effort: Pulmonary effort is normal.  Abdominal:     General: There is no distension.     Palpations: Abdomen is soft.     Tenderness: There is abdominal tenderness.     Hernia: A hernia is present.  Musculoskeletal:        General: Normal range of motion.     Cervical back: Normal range of motion.  Skin:    General: Skin is warm.  Neurological:     General: No focal deficit present.     Mental Status: He is alert.  Psychiatric:        Mood and Affect: Mood normal.        Thought Content: Thought content normal.        Judgment: Judgment normal.        Results: Personally reviewed- CT abd- 1cm umbilical defect with fat CLINICAL DATA:  Chronic constipation.  Prior appendectomy.   EXAM: CT ABDOMEN AND PELVIS WITH CONTRAST   TECHNIQUE: Multidetector CT imaging of the abdomen and pelvis was performed using the standard protocol following bolus administration of intravenous contrast.   RADIATION DOSE REDUCTION: This exam was performed according to the departmental dose-optimization program which includes automated exposure control, adjustment of the mA and/or kV according to patient size and/or use of iterative reconstruction technique.   CONTRAST:  130m OMNIPAQUE IOHEXOL 300 MG/ML  SOLN   COMPARISON:  Plain film of 02/03/2022.  Most recent CT of 12/14/2018   FINDINGS: Lower chest: Clear lung bases. Normal heart size without pericardial or pleural effusion.   Hepatobiliary: Mild hepatic steatosis, without focal liver lesion. Normal gallbladder, without biliary ductal dilatation.   Pancreas: Normal, without mass or ductal dilatation.   Spleen: Normal in  size, without focal abnormality.   Adrenals/Urinary Tract: Minimal left adrenal nodularity. Normal right adrenal gland. Too small to characterize right renal lesions. Normal left kidney. Perinephric interstitial thickening is chronic, greater left  than right. Apparent nodularity within the left perinephric space medially on 30/2 and coronal image 85 is new since 2020. No hydronephrosis. Normal urinary bladder.   Stomach/Bowel: Normal stomach, without wall thickening. Moderate amount of colonic stool. No obstructive mass, evidence of fecal impaction or colitis.   Normal terminal ileum.  Appendectomy. Normal small bowel.   Vascular/Lymphatic: Aortic atherosclerosis. No abdominopelvic adenopathy.   Reproductive: Normal prostate.   Other: No significant free fluid. No free intraperitoneal air. Small fat containing paraumbilical hernia.   Musculoskeletal: Mild disc bulges at L3-4 through L5-S1.   IMPRESSION: 1. No acute process in the abdomen or pelvis. No explanation for constipation. Moderate stool burden. 2. Left-greater than right perinephric interstitial thickening is nonspecific, can be seen with renal insufficiency. Suggestion of developing perinephric nodularity which is also favored to be of a similar etiology. Consider abdominal CT follow-up at 6 months to exclude unlikely alternate etiologies such as lymphoproliferative process. 3. Hepatic steatosis. 4. Aortic Atherosclerosis (ICD10-I70.0).     Electronically Signed   By: Abigail Miyamoto M.D.   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 male with an umbilical hernia. He also has some constipation. The hernia only has fat in it. Discussed repair and risk of bleeding, infection, recurrence, use of mesh.    Plan for OR 8/18 Metamucil for constipation    All questions were answered to the satisfaction of the patient and family.         Virl Cagey 04/25/2022, 12:18 PM

## 2022-05-09 NOTE — Patient Instructions (Signed)
Frank White  05/09/2022     '@PREFPERIOPPHARMACY'$ @   Your procedure is scheduled on  05/13/2022.   Report to [facility] at  0600  A.M.   Call this number if you have problems the morning of surgery:  [phone]   Remember:  Do not eat or drink after midnight.      Take these medicines the morning of surgery with A SIP OF WATER                               norvasc, fioricet(if needed).     Do not wear jewelry, make-up or nail polish.  Do not wear lotions, powders, or perfumes, or deodorant.  Do not shave 48 hours prior to surgery.  Men may shave face and neck.  Do not bring valuables to the hospital.  [facility] is not responsible for any belongings or valuables.  Contacts, dentures or bridgework may not be worn into surgery.  Leave your suitcase in the car.  After surgery it may be brought to your room.  For patients admitted to the hospital, discharge time will be determined by your treatment team.  Patients discharged the day of surgery will not be allowed to drive home and must have someone with them for 24 hours.    Special instructions:   DO NOT smoke tobacco or vape for 24 hours before your procedure.  Please read over the following fact sheets that you were given. Coughing and Deep Breathing, Surgical Site Infection Prevention, Anesthesia Post-op Instructions, and Care and Recovery After Surgery      Open Hernia Repair, Adult, Care After What can I expect after the procedure? After the procedure, it is common to have: Mild discomfort. Slight bruising. Mild swelling. Pain in the belly (abdomen). A small amount of blood from the cut from surgery (incision). Follow these instructions at home: Your doctor may give you more specific instructions. If you have problems, call your doctor. Medicines Take over-the-counter and prescription medicines only as told by your doctor. If told, take steps to prevent problems with pooping (constipation).  You may need to: Drink enough fluid to keep your pee (urine) pale yellow. Take medicines. You will be told what medicines to take. Eat foods that are high in fiber. These include beans, whole grains, and fresh fruits and vegetables. Limit foods that are high in fat and sugar. These include fried or sweet foods. Ask your doctor if you should avoid driving or using machines while you are taking your medicine. Incision care  Follow instructions from your doctor about how to take care of your incision. Make sure you: Wash your hands with soap and water for at least 20 seconds before and after you change your bandage (dressing). If you cannot use soap and water, use hand sanitizer. Change your bandage. Leave stitches or skin glue in place for at least 2 weeks. Leave tape strips alone unless you are told to take them off. You may trim the edges of the tape strips if they curl up. Check your incision every day for signs of infection. Check for: More redness, swelling, or pain. More fluid or blood. Warmth. Pus or a bad smell. Wear loose, soft clothing while your incision heals. Activity  Rest as told by your doctor. Do not lift anything that is heavier than 10 lb (4.5 kg), or the limit that you are told.  Do not play contact sports until your doctor says that this is safe. If you were given a sedative during your procedure, do not drive or use machines until your doctor says that it is safe. A sedative is a medicine that helps you relax. Return to your normal activities when your doctor says that it is safe. General instructions Do not take baths, swim, or use a hot tub. Ask your doctor about taking showers or sponge baths. Hold a pillow over your belly when you cough or sneeze. This helps with pain. Do not smoke or use any products that contain nicotine or tobacco. If you need help quitting, ask your doctor. Keep all follow-up visits. Contact a doctor if: You have any of these signs of  infection in or around your incision: More redness, swelling, or pain. More fluid or blood. Warmth. Pus. A bad smell. You have a fever or chills. You have blood in your poop (stool). You have not pooped (had a bowel movement) in 2-3 days. Medicine does not help your pain. Get help right away if: You have chest pain, or you are short of breath. You feel faint or light-headed. You have very bad pain. You vomit and your pain is worse. You have pain, swelling, or redness in a leg. These symptoms may be an emergency. Get help right away. Call your local emergency services (911 in the [place] Do not wait to see if the symptoms will go away. Do not drive yourself to the hospital. Summary After this procedure, it is common to have mild discomfort, slight bruising, and mild swelling. Follow instructions from your doctor about how to take care of your cut from surgery (incision). Check every day for signs of infection. Do not lift heavy objects or play contact sports until your doctor says it is safe. Return to your normal activities as told by your doctor. This information is not intended to replace advice given to you by your health care provider. Make sure you discuss any questions you have with your health care provider. Document Revised: 04/27/2020 Document Reviewed: 04/27/2020 Elsevier Patient Education  [place] Anesthesia, Adult, Care After The following information offers guidance on how to care for yourself after your procedure. Your health care provider may also give you more specific instructions. If you have problems or questions, contact your health care provider. What can I expect after the procedure? After the procedure, it is common for people to: Have pain or discomfort at the IV site. Have nausea or vomiting. Have a sore throat or hoarseness. Have trouble concentrating. Feel cold or chills. Feel weak, sleepy, or tired (fatigue). Have soreness and body  aches. These can affect parts of the body that were not involved in surgery. Follow these instructions at home: For the time period you were told by your health care provider:  Rest. Do not participate in activities where you could fall or become injured. Do not drive or use machinery. Do not drink alcohol. Do not take sleeping pills or medicines that cause drowsiness. Do not make important decisions or sign legal documents. Do not take care of children on your own. General instructions Drink enough fluid to keep your urine pale yellow. If you have sleep apnea, surgery and certain medicines can increase your risk for breathing problems. Follow instructions from your health care provider about wearing your sleep device: Anytime you are sleeping, including during daytime naps. While taking prescription pain medicines, sleeping medicines, or medicines that make you  drowsy. Return to your normal activities as told by your health care provider. Ask your health care provider what activities are safe for you. Take over-the-counter and prescription medicines only as told by your health care provider. Do not use any products that contain nicotine or tobacco. These products include cigarettes, chewing tobacco, and vaping devices, such as e-cigarettes. These can delay incision healing after surgery. If you need help quitting, ask your health care provider. Contact a health care provider if: You have nausea or vomiting that does not get better with medicine. You vomit every time you eat or drink. You have pain that does not get better with medicine. You cannot urinate or have bloody urine. You develop a skin rash. You have a fever. Get help right away if: You have trouble breathing. You have chest pain. You vomit blood. These symptoms may be an emergency. Get help right away. Call 911. Do not wait to see if the symptoms will go away. Do not drive yourself to the hospital. Summary After the  procedure, it is common to have a sore throat, hoarseness, nausea, vomiting, or to feel weak, sleepy, or fatigue. For the time period you were told by your health care provider, do not drive or use machinery. Get help right away if you have difficulty breathing, have chest pain, or vomit blood. These symptoms may be an emergency. This information is not intended to replace advice given to you by your health care provider. Make sure you discuss any questions you have with your health care provider. Document Revised: 12/10/2021 Document Reviewed: 12/10/2021 Elsevier Patient Education  [place]. How to Use Chlorhexidine Before Surgery Chlorhexidine gluconate (CHG) is a germ-killing (antiseptic) solution that is used to clean the skin. It can get rid of the bacteria that normally live on the skin and can keep them away for about 24 hours. To clean your skin with CHG, you may be given: A CHG solution to use in the shower or as part of a sponge bath. A prepackaged cloth that contains CHG. Cleaning your skin with CHG may help lower the risk for infection: While you are staying in the intensive care unit of the hospital. If you have a vascular access, such as a central line, to provide short-term or long-term access to your veins. If you have a catheter to drain urine from your bladder. If you are on a ventilator. A ventilator is a machine that helps you breathe by moving air in and out of your lungs. After surgery. What are the risks? Risks of using CHG include: A skin reaction. Hearing loss, if CHG gets in your ears and you have a perforated eardrum. Eye injury, if CHG gets in your eyes and is not rinsed out. The CHG product catching fire. Make sure that you avoid smoking and flames after applying CHG to your skin. Do not use CHG: If you have a chlorhexidine allergy or have previously reacted to chlorhexidine. On babies younger than 72 months of age. How to use CHG solution Use CHG only  as told by your health care provider, and follow the instructions on the label. Use the full amount of CHG as directed. Usually, this is one bottle. During a shower Follow these steps when using CHG solution during a shower (unless your health care provider gives you different instructions): Start the shower. Use your normal soap and shampoo to wash your face and hair. Turn off the shower or move out of the shower stream. Pour  the CHG onto a clean washcloth. Do not use any type of brush or rough-edged sponge. Starting at your neck, lather your body down to your toes. Make sure you follow these instructions: If you will be having surgery, pay special attention to the part of your body where you will be having surgery. Scrub this area for at least 1 minute. Do not use CHG on your head or face. If the solution gets into your ears or eyes, rinse them well with water. Avoid your genital area. Avoid any areas of skin that have broken skin, cuts, or scrapes. Scrub your back and under your arms. Make sure to wash skin folds. Let the lather sit on your skin for 1-2 minutes or as long as told by your health care provider. Thoroughly rinse your entire body in the shower. Make sure that all body creases and crevices are rinsed well. Dry off with a clean towel. Do not put any substances on your body afterward--such as powder, lotion, or perfume--unless you are told to do so by your health care provider. Only use lotions that are recommended by the manufacturer. Put on clean clothes or pajamas. If it is the night before your surgery, sleep in clean sheets.  During a sponge bath Follow these steps when using CHG solution during a sponge bath (unless your health care provider gives you different instructions): Use your normal soap and shampoo to wash your face and hair. Pour the CHG onto a clean washcloth. Starting at your neck, lather your body down to your toes. Make sure you follow these instructions: If  you will be having surgery, pay special attention to the part of your body where you will be having surgery. Scrub this area for at least 1 minute. Do not use CHG on your head or face. If the solution gets into your ears or eyes, rinse them well with water. Avoid your genital area. Avoid any areas of skin that have broken skin, cuts, or scrapes. Scrub your back and under your arms. Make sure to wash skin folds. Let the lather sit on your skin for 1-2 minutes or as long as told by your health care provider. Using a different clean, wet washcloth, thoroughly rinse your entire body. Make sure that all body creases and crevices are rinsed well. Dry off with a clean towel. Do not put any substances on your body afterward--such as powder, lotion, or perfume--unless you are told to do so by your health care provider. Only use lotions that are recommended by the manufacturer. Put on clean clothes or pajamas. If it is the night before your surgery, sleep in clean sheets. How to use CHG prepackaged cloths Only use CHG cloths as told by your health care provider, and follow the instructions on the label. Use the CHG cloth on clean, dry skin. Do not use the CHG cloth on your head or face unless your health care provider tells you to. When washing with the CHG cloth: Avoid your genital area. Avoid any areas of skin that have broken skin, cuts, or scrapes. Before surgery Follow these steps when using a CHG cloth to clean before surgery (unless your health care provider gives you different instructions): Using the CHG cloth, vigorously scrub the part of your body where you will be having surgery. Scrub using a back-and-forth motion for 3 minutes. The area on your body should be completely wet with CHG when you are done scrubbing. Do not rinse. Discard the cloth and let the  area air-dry. Do not put any substances on the area afterward, such as powder, lotion, or perfume. Put on clean clothes or pajamas. If it  is the night before your surgery, sleep in clean sheets.  For general bathing Follow these steps when using CHG cloths for general bathing (unless your health care provider gives you different instructions). Use a separate CHG cloth for each area of your body. Make sure you wash between any folds of skin and between your fingers and toes. Wash your body in the following order, switching to a new cloth after each step: The front of your neck, shoulders, and chest. Both of your arms, under your arms, and your hands. Your stomach and groin area, avoiding the genitals. Your right leg and foot. Your left leg and foot. The back of your neck, your back, and your buttocks. Do not rinse. Discard the cloth and let the area air-dry. Do not put any substances on your body afterward--such as powder, lotion, or perfume--unless you are told to do so by your health care provider. Only use lotions that are recommended by the manufacturer. Put on clean clothes or pajamas. Contact a health care provider if: Your skin gets irritated after scrubbing. You have questions about using your solution or cloth. You swallow any chlorhexidine. Call your local poison control center (1-(787) 547-0715 in the [place] Get help right away if: Your eyes itch badly, or they become very red or swollen. Your skin itches badly and is red or swollen. Your hearing changes. You have trouble seeing. You have swelling or tingling in your mouth or throat. You have trouble breathing. These symptoms may represent a serious problem that is an emergency. Do not wait to see if the symptoms will go away. Get medical help right away. Call your local emergency services (911 in the [place] Do not drive yourself to the hospital. Summary Chlorhexidine gluconate (CHG) is a germ-killing (antiseptic) solution that is used to clean the skin. Cleaning your skin with CHG may help to lower your risk for infection. You may be given CHG to use for bathing. It  may be in a bottle or in a prepackaged cloth to use on your skin. Carefully follow your health care provider's instructions and the instructions on the product label. Do not use CHG if you have a chlorhexidine allergy. Contact your health care provider if your skin gets irritated after scrubbing. This information is not intended to replace advice given to you by your health care provider. Make sure you discuss any questions you have with your health care provider. Document Revised: 01/10/2022 Document Reviewed: 11/23/2020 Elsevier Patient Education  [place].

## 2022-05-11 ENCOUNTER — Encounter (HOSPITAL_COMMUNITY)
Admission: RE | Admit: 2022-05-11 | Discharge: 2022-05-11 | Disposition: A | Discharge status: Home / Self Care | Payer: Medicare Other | Source: Ambulatory Visit | Attending: General Surgery | Admitting: General Surgery

## 2022-05-11 ENCOUNTER — Telehealth: Payer: Self-pay | Admitting: *Deleted

## 2022-05-11 VITALS — BP 144/84 | HR 57 | Temp 97.9°F | Resp 18 | Ht 68.0 in | Wt 207.0 lb

## 2022-05-11 DIAGNOSIS — I10 Essential (primary) hypertension: Secondary | ICD-10-CM | POA: Diagnosis not present

## 2022-05-11 DIAGNOSIS — Z79899 Other long term (current) drug therapy: Secondary | ICD-10-CM

## 2022-05-11 DIAGNOSIS — Z01818 Encounter for other preprocedural examination: Secondary | ICD-10-CM | POA: Diagnosis not present

## 2022-05-11 LAB — BASIC METABOLIC PANEL WITH GFR
Anion gap: 8 (ref 5–15)
BUN: 13 mg/dL (ref 8–23)
CO2: 29 mmol/L (ref 22–32)
Calcium: 9.6 mg/dL (ref 8.9–10.3)
Chloride: 101 mmol/L (ref 98–111)
Creatinine, Ser: 0.91 mg/dL (ref 0.61–1.24)
GFR, Estimated: >60 mL/min
Glucose, Bld: 118 mg/dL — ABNORMAL HIGH (ref 70–99)
Potassium: 3 mmol/L — ABNORMAL LOW (ref 3.5–5.1)
Sodium: 138 mmol/L (ref 135–145)

## 2022-05-11 MED ORDER — POTASSIUM CHLORIDE CRYS ER 20 MEQ PO TBCR
EXTENDED_RELEASE_TABLET | ORAL | 0 refills | Status: AC
Start: 2022-05-11 — End: ?

## 2022-05-11 MED ORDER — POTASSIUM CHLORIDE CRYS ER 20 MEQ PO TBCR: EXTENDED_RELEASE_TABLET | ORAL | 0 refills | Status: DC

## 2022-05-11 NOTE — Telephone Encounter (Signed)
Patient seen for preoperative visit on 05/11/2022. Patient noted to have hypokalemia (K+ 3.0).   Verbal orders received from Dr. Constance Haw for Klon Con 20 mEq BID x3 doses prior to surgery.   Prescription sent to pharmacy.   Call placed to patient and patient wife Frank White made aware.

## 2022-05-13 ENCOUNTER — Encounter (HOSPITAL_COMMUNITY)
Admission: RE | Disposition: A | Discharge status: Home / Self Care | Payer: Self-pay | Source: Home / Self Care | Attending: General Surgery

## 2022-05-13 ENCOUNTER — Ambulatory Visit (HOSPITAL_BASED_OUTPATIENT_CLINIC_OR_DEPARTMENT_OTHER): Payer: Medicare Other | Admitting: Anesthesiology

## 2022-05-13 ENCOUNTER — Encounter (HOSPITAL_COMMUNITY): Payer: Self-pay | Admitting: General Surgery

## 2022-05-13 ENCOUNTER — Ambulatory Visit (HOSPITAL_COMMUNITY)
Admission: RE | Admit: 2022-05-13 | Discharge: 2022-05-13 | Disposition: A | Discharge status: Home / Self Care | Payer: Medicare Other | Attending: General Surgery | Admitting: General Surgery

## 2022-05-13 ENCOUNTER — Ambulatory Visit (HOSPITAL_COMMUNITY): Payer: Medicare Other | Admitting: Anesthesiology

## 2022-05-13 DIAGNOSIS — I10 Essential (primary) hypertension: Secondary | ICD-10-CM | POA: Diagnosis not present

## 2022-05-13 DIAGNOSIS — Z79899 Other long term (current) drug therapy: Secondary | ICD-10-CM | POA: Insufficient documentation

## 2022-05-13 DIAGNOSIS — F17210 Nicotine dependence, cigarettes, uncomplicated: Secondary | ICD-10-CM | POA: Insufficient documentation

## 2022-05-13 DIAGNOSIS — K5909 Other constipation: Secondary | ICD-10-CM | POA: Diagnosis not present

## 2022-05-13 DIAGNOSIS — K429 Umbilical hernia without obstruction or gangrene: Secondary | ICD-10-CM

## 2022-05-13 HISTORY — PX: UMBILICAL HERNIA REPAIR: SHX196

## 2022-05-13 LAB — POCT I-STAT, CHEM 8
BUN: 19 mg/dL (ref 8–23)
Calcium, Ion: 1.27 mmol/L (ref 1.15–1.40)
Chloride: 100 mmol/L (ref 98–111)
Creatinine, Ser: 0.8 mg/dL (ref 0.61–1.24)
Glucose, Bld: 116 mg/dL — ABNORMAL HIGH (ref 70–99)
HCT: 49 % (ref 39.0–52.0)
Hemoglobin: 16.7 g/dL (ref 13.0–17.0)
Potassium: 3.5 mmol/L (ref 3.5–5.1)
Sodium: 142 mmol/L (ref 135–145)
TCO2: 25 mmol/L (ref 22–32)

## 2022-05-13 SURGERY — REPAIR, HERNIA, UMBILICAL, ADULT
Anesthesia: General | Site: Abdomen

## 2022-05-13 MED ORDER — SODIUM CHLORIDE 0.9 % IR SOLN
Status: DC | PRN
  Administered 2022-05-13: 1

## 2022-05-13 MED ORDER — PROPOFOL 10 MG/ML IV BOLUS
INTRAVENOUS | Status: DC | PRN
  Administered 2022-05-13: 200 mg via INTRAVENOUS

## 2022-05-13 MED ORDER — CHLORHEXIDINE GLUCONATE CLOTH 2 % EX PADS: 6.0000 | MEDICATED_PAD | Freq: Once | CUTANEOUS | Status: DC

## 2022-05-13 MED ORDER — BUPIVACAINE LIPOSOME 1.3 % IJ SUSP
INTRAMUSCULAR | Status: AC
  Filled 2022-05-13: qty 20

## 2022-05-13 MED ORDER — CHLORHEXIDINE GLUCONATE 0.12 % MT SOLN
15.0000 mL | Freq: Once | OROMUCOSAL | Status: AC
  Administered 2022-05-13: 15 mL via OROMUCOSAL

## 2022-05-13 MED ORDER — LACTATED RINGERS IV SOLN
INTRAVENOUS | Status: DC
  Administered 2022-05-13: 1000 mL via INTRAVENOUS

## 2022-05-13 MED ORDER — ONDANSETRON HCL 4 MG PO TABS: 4.0000 mg | ORAL_TABLET | Freq: Three times a day (TID) | ORAL | 1 refills | Status: DC | PRN

## 2022-05-13 MED ORDER — DEXAMETHASONE SODIUM PHOSPHATE 10 MG/ML IJ SOLN
INTRAMUSCULAR | Status: AC
  Filled 2022-05-13: qty 1

## 2022-05-13 MED ORDER — MIDAZOLAM HCL 2 MG/2ML IJ SOLN
INTRAMUSCULAR | Status: AC
  Filled 2022-05-13: qty 2

## 2022-05-13 MED ORDER — DEXAMETHASONE SODIUM PHOSPHATE 4 MG/ML IJ SOLN
INTRAMUSCULAR | Status: DC | PRN
  Administered 2022-05-13: 4 mg via INTRAVENOUS

## 2022-05-13 MED ORDER — FENTANYL CITRATE (PF) 100 MCG/2ML IJ SOLN
INTRAMUSCULAR | Status: DC | PRN
  Administered 2022-05-13 (×3): 50 ug via INTRAVENOUS

## 2022-05-13 MED ORDER — FENTANYL CITRATE (PF) 100 MCG/2ML IJ SOLN
INTRAMUSCULAR | Status: AC
  Filled 2022-05-13: qty 2

## 2022-05-13 MED ORDER — ONDANSETRON HCL 4 MG/2ML IJ SOLN
INTRAMUSCULAR | Status: AC
  Filled 2022-05-13: qty 2

## 2022-05-13 MED ORDER — LIDOCAINE HCL (CARDIAC) PF 100 MG/5ML IV SOSY
PREFILLED_SYRINGE | INTRAVENOUS | Status: DC | PRN
  Administered 2022-05-13: 50 mg via INTRAVENOUS

## 2022-05-13 MED ORDER — MIDAZOLAM HCL 5 MG/5ML IJ SOLN
INTRAMUSCULAR | Status: DC | PRN
  Administered 2022-05-13: 2 mg via INTRAVENOUS

## 2022-05-13 MED ORDER — ORAL CARE MOUTH RINSE: 15.0000 mL | Freq: Once | OROMUCOSAL | Status: AC

## 2022-05-13 MED ORDER — ONDANSETRON HCL 4 MG/2ML IJ SOLN
INTRAMUSCULAR | Status: DC | PRN
  Administered 2022-05-13: 4 mg via INTRAVENOUS

## 2022-05-13 MED ORDER — CEFAZOLIN SODIUM-DEXTROSE 2-4 GM/100ML-% IV SOLN
2.0000 g | INTRAVENOUS | Status: AC
  Administered 2022-05-13: 2 g via INTRAVENOUS
  Filled 2022-05-13: qty 100

## 2022-05-13 MED ORDER — OXYCODONE HCL 5 MG PO TABS: 5.0000 mg | ORAL_TABLET | ORAL | 0 refills | Status: DC | PRN

## 2022-05-13 MED ORDER — LIDOCAINE HCL (PF) 2 % IJ SOLN
INTRAMUSCULAR | Status: AC
  Filled 2022-05-13: qty 5

## 2022-05-13 MED ORDER — BUPIVACAINE LIPOSOME 1.3 % IJ SUSP
INTRAMUSCULAR | Status: DC | PRN
  Administered 2022-05-13: 20 mL

## 2022-05-13 SURGICAL SUPPLY — 39 items
ADH SKN CLS APL DERMABOND .7 (GAUZE/BANDAGES/DRESSINGS) ×1
APL PRP STRL LF DISP 70% ISPRP (MISCELLANEOUS) ×1
BLADE SURG 15 STRL LF DISP TIS (BLADE) ×2 IMPLANT
BLADE SURG 15 STRL SS (BLADE) ×1
CHLORAPREP W/TINT 26 (MISCELLANEOUS) ×2 IMPLANT
CLOTH BEACON ORANGE TIMEOUT ST (SAFETY) ×2 IMPLANT
COVER LIGHT HANDLE STERIS (MISCELLANEOUS) ×4 IMPLANT
DERMABOND ADVANCED (GAUZE/BANDAGES/DRESSINGS) ×1
DERMABOND ADVANCED .7 DNX12 (GAUZE/BANDAGES/DRESSINGS) ×2 IMPLANT
DRSG TEGADERM 2-3/8X2-3/4 SM (GAUZE/BANDAGES/DRESSINGS) IMPLANT
ELECT REM PT RETURN 9FT ADLT (ELECTROSURGICAL) ×1
ELECTRODE REM PT RTRN 9FT ADLT (ELECTROSURGICAL) ×2 IMPLANT
GAUZE 4X4 16PLY ~~LOC~~+RFID DBL (SPONGE) ×2 IMPLANT
GAUZE SPONGE 2X2 8PLY STRL LF (GAUZE/BANDAGES/DRESSINGS) IMPLANT
GLOVE BIO SURGEON STRL SZ 6.5 (GLOVE) ×2 IMPLANT
GLOVE BIOGEL PI IND STRL 6.5 (GLOVE) ×2 IMPLANT
GLOVE BIOGEL PI IND STRL 7.0 (GLOVE) ×4 IMPLANT
GLOVE BIOGEL PI INDICATOR 6.5 (GLOVE) ×2
GLOVE BIOGEL PI INDICATOR 7.0 (GLOVE) ×1
GOWN STRL REUS W/TWL LRG LVL3 (GOWN DISPOSABLE) ×4 IMPLANT
INST SET MINOR GENERAL (KITS) ×2 IMPLANT
KIT TURNOVER KIT A (KITS) ×2 IMPLANT
MANIFOLD NEPTUNE II (INSTRUMENTS) ×2 IMPLANT
MESH VENTRALEX ST 1-7/10 CRC S (Mesh General) IMPLANT
NDL HYPO 18GX1.5 BLUNT FILL (NEEDLE) ×2 IMPLANT
NDL HYPO 21X1.5 SAFETY (NEEDLE) ×2 IMPLANT
NEEDLE HYPO 18GX1.5 BLUNT FILL (NEEDLE) ×1 IMPLANT
NEEDLE HYPO 21X1.5 SAFETY (NEEDLE) ×1 IMPLANT
NS IRRIG 1000ML POUR BTL (IV SOLUTION) ×2 IMPLANT
PACK MINOR (CUSTOM PROCEDURE TRAY) ×2 IMPLANT
PAD ARMBOARD 7.5X6 YLW CONV (MISCELLANEOUS) ×2 IMPLANT
PENCIL SMOKE EVACUATOR (MISCELLANEOUS) ×2 IMPLANT
SET BASIN LINEN APH (SET/KITS/TRAYS/PACK) ×2 IMPLANT
SPONGE GAUZE 2X2 8PLY STRL LF (GAUZE/BANDAGES/DRESSINGS) IMPLANT
SUT ETHIBOND NAB MO 7 #0 18IN (SUTURE) ×2 IMPLANT
SUT MNCRL AB 4-0 PS2 18 (SUTURE) ×2 IMPLANT
SUT VIC AB 3-0 SH 27 (SUTURE) ×1
SUT VIC AB 3-0 SH 27X BRD (SUTURE) ×2 IMPLANT
SYR 20ML LL LF (SYRINGE) ×4 IMPLANT

## 2022-05-13 NOTE — Procedures (Signed)
[facility]  Updated wife. Rx to pharmacy. Will see in 4 weeks.  Curlene Labrum, MD [facility] [address] [place],  [id number] [phone] (office)

## 2022-05-13 NOTE — Anesthesia Procedure Notes (Signed)
Procedure Name: LMA Insertion Date/Time: 05/13/2022 7:37 AM  Performed by: Lieutenant Diego, CRNAPre-anesthesia Checklist: Patient identified, Emergency Drugs available, Suction available and Patient being monitored Patient Re-evaluated:Patient Re-evaluated prior to induction Oxygen Delivery Method: Circle system utilized Preoxygenation: Pre-oxygenation with 100% oxygen Induction Type: IV induction Ventilation: Mask ventilation without difficulty LMA: LMA inserted LMA Size: 4.0 Number of attempts: 1 Placement Confirmation: positive ETCO2 and breath sounds checked- equal and bilateral Tube secured with: Tape Dental Injury: Teeth and Oropharynx as per pre-operative assessment

## 2022-05-13 NOTE — Transfer of Care (Signed)
Immediate Anesthesia Transfer of Care Note  Patient: ZYION DOXTATER  Procedure(s) Performed: HERNIA REPAIR UMBILICAL ADULT (Abdomen)  Patient Location: PACU  Anesthesia Type:General  Level of Consciousness: drowsy  Airway & Oxygen Therapy: Patient Spontanous Breathing and Patient connected to nasal cannula oxygen  Post-op Assessment: Report given to RN and Post -op Vital signs reviewed and stable  Post vital signs: Reviewed and stable  Last Vitals:  Vitals Value Taken Time  BP    Temp    Pulse    Resp    SpO2      Last Pain:  Vitals:   05/13/22 0656  TempSrc: Oral  PainSc: 0-No pain      Patients Stated Pain Goal: 8 (63/84/66 [id number])  Complications: No notable events documented.

## 2022-05-13 NOTE — Interval H&P Note (Signed)
History and Physical Interval Note:  05/13/2022 7:26 AM  Frank White  has presented today for surgery, with the diagnosis of UMBILICAL HERNIA, 1 CM.  The various methods of treatment have been discussed with the patient and family. After consideration of risks, benefits and other options for treatment, the patient has consented to  Procedure(s): HERNIA REPAIR UMBILICAL ADULT (N/A) as a surgical intervention.  The patient's history has been reviewed, patient examined, no change in status, stable for surgery.  I have reviewed the patient's chart and labs.  Questions were answered to the patient's satisfaction.     Virl Cagey

## 2022-05-13 NOTE — Anesthesia Postprocedure Evaluation (Signed)
Anesthesia Post Note  Patient: Frank White  Procedure(s) Performed: HERNIA REPAIR UMBILICAL ADULT (Abdomen)  Patient location during evaluation: Phase II Anesthesia Type: General Level of consciousness: awake and alert and oriented Pain management: pain level controlled Vital Signs Assessment: post-procedure vital signs reviewed and stable Respiratory status: spontaneous breathing, nonlabored ventilation and respiratory function stable Cardiovascular status: stable and blood pressure returned to baseline Postop Assessment: no apparent nausea or vomiting Anesthetic complications: no   No notable events documented.   Last Vitals:  Vitals:   05/13/22 0900 05/13/22 0915  BP: 122/77 129/78  Pulse: (!) 55 (!) 55  Resp: 11 17  Temp:  36.7 C  SpO2: 93% 95%    Last Pain:  Vitals:   05/13/22 0915  TempSrc: Oral  PainSc: 0-No pain                 Josalynn Johndrow C Dj Senteno

## 2022-05-13 NOTE — Anesthesia Preprocedure Evaluation (Signed)
Anesthesia Evaluation  Patient identified by MRN, date of birth, ID band Patient awake    Reviewed: Allergy & Precautions, NPO status , Patient's Chart, lab work & pertinent test results  Airway Mallampati: II  TM Distance: >3 FB Neck ROM: Full    Dental  (+) Dental Advisory Given, Missing   Pulmonary Current Smoker and Patient abstained from smoking.,    Pulmonary exam normal breath sounds clear to auscultation       Cardiovascular Exercise Tolerance: Good hypertension, Pt. on medications Normal cardiovascular exam Rhythm:Regular Rate:Normal     Neuro/Psych negative neurological ROS  negative psych ROS   GI/Hepatic negative GI ROS, Neg liver ROS,   Endo/Other  negative endocrine ROS  Renal/GU negative Renal ROS  negative genitourinary   Musculoskeletal negative musculoskeletal ROS (+)   Abdominal   Peds negative pediatric ROS (+)  Hematology negative hematology ROS (+)   Anesthesia Other Findings   Reproductive/Obstetrics negative OB ROS                            Anesthesia Physical Anesthesia Plan  ASA: 2  Anesthesia Plan: General   Post-op Pain Management: Dilaudid IV   Induction: Intravenous  PONV Risk Score and Plan: 2 and Ondansetron and Dexamethasone  Airway Management Planned: LMA  Additional Equipment:   Intra-op Plan:   Post-operative Plan:   Informed Consent: I have reviewed the patients History and Physical, chart, labs and discussed the procedure including the risks, benefits and alternatives for the proposed anesthesia with the patient or authorized representative who has indicated his/her understanding and acceptance.     Dental advisory given  Plan Discussed with: CRNA and Surgeon  Anesthesia Plan Comments:        Anesthesia Quick Evaluation

## 2022-05-13 NOTE — Discharge Instructions (Signed)
Discharge Instructions Hernia:  Common Complaints: Pain at the incision site is common. This will improve with time. Take your pain medications as described below. Some nausea is common and poor appetite. The main goal is to stay hydrated the first few days after surgery.   Diet/ Activity: Diet as tolerated. You may not have an appetite, but it is important to stay hydrated. Drink 64 ounces of water a day. Your appetite will return with time.  Remove the small clear dressing and gauze after two days (48 hours). Trim the gauze off the glue that is underneath if the gauze is stuck to the glue. Shower per your regular routine daily.  Do not take hot showers. Take warm showers that are less than 10 minutes. Rest and listen to your body, but do not remain in bed all day.Walk everyday for at least 15-20 minutes.  Deep cough and move around every 1-2 hours in the first few days after surgery. Do not pick at the dermabond glue on your incision sites.  This glue film will remain in place for 1-2 weeks and will start to peel off. Do not place lotions or balms on your incision unless instructed to specifically by Dr. Constance Haw. Do not lift > 10 lbs, perform excessive bending, pushing, pulling, squatting for 6-8 weeks after surgery. Where your abdominal binder with activity as much as possible. The activity restrictions and the abdominal binder are to prevent hernia formation at your incision while you are healing.   Pain Expectations and Narcotics: -After surgery you will have pain associated with your incisions and this is normal. The pain is muscular and nerve pain, and will get better with time. -You are encouraged and expected to take non narcotic medications like tylenol and ibuprofen (when able) to treat pain as multiple modalities can aid with pain treatment. -Narcotics are only used when pain is severe or there is breakthrough pain. -You are not expected to have a pain score of 0 after surgery, as  we cannot prevent pain. A pain score of 3-4 that allows you to be functional, move, walk, and tolerate some activity is the goal. The pain will continue to improve over the days after surgery and is dependent on your surgery. -Due to [place] law, we are only able to give a certain amount of pain medication to treat post operative pain, and we only give additional narcotics on a patient by patient basis.  -For most laparoscopic surgery, studies have shown that the majority of patients only need 10-15 narcotic pills, and for open surgeries most patients only need 15-20.   -Having appropriate expectations of pain and knowledge of pain management with non narcotics is important as we do not want anyone to become addicted to narcotic pain medication.  -Using ice packs in the first 48 hours and heating pads after 48 hours, wearing an abdominal binder (when recommended), and using over the counter medications are all ways to help with pain management.   -Simple acts like meditation and mindfulness practices after surgery can also help with pain control and research has proven the benefit of these practices.  Medication: Take tylenol and ibuprofen as needed for pain control, alternating every 4-6 hours.  Example:  Tylenol '1000mg'$  @ 6am, 12noon, 6pm, 9mdnight (Do not exceed '4000mg'$  of tylenol a day). Ibuprofen '800mg'$  @ 9am, 3pm, 9pm, 3am (Do not exceed '3600mg'$  of ibuprofen a day).  Take Roxicodone for breakthrough pain every 4 hours.  Take Colace for constipation related to narcotic  pain medication. If you do not have a bowel movement in 2 days, take Miralax over the counter.  Drink plenty of water to also prevent constipation.   Contact Information: If you have questions or concerns, please call our office, [phone], Monday- Thursday 8AM-5PM and Friday 8AM-12Noon.  If it is after hours or on the weekend, please call [facility], [phone], [phone], and ask to speak to the surgeon on call for Dr.  Constance Haw at [facility].

## 2022-05-13 NOTE — Op Note (Signed)
[facility] Operative Note  05/13/22  Preoperative Diagnosis: Umbilical hernia    Postoperative Diagnosis: Same   Procedure(s) Performed: Umbilical hernia repair with mesh (1.5 cm defect)    Surgeon: Lanell Matar. Constance Haw, MD   Assistants: No qualified resident was available    Anesthesia: General anesthesia    Anesthesiologist: Dr. Charna Elizabeth    Specimens: None   Estimated Blood Loss: Minimal   Blood Replacement: None    Complications: None   Wound Class: Clean    Operative Indications: Mr. Frank White is a 61 yo with a umbilical hernia that is having issues with discomfort. We discussed repair and risk of bleeding, infection, injury to bowel, use of mesh, recurrence risk.   Findings: Small 1.5 cm defect    Procedure: The patient was taken to the operating room and placed supine. General endotracheal anesthesia was induced. Intravenous antibiotics were administered per protocol.  The abdomen was prepared and draped in the usual sterile fashion.   The umbilical hernia was noted to be reducible and measured about 1.5cm. An incision was made under the umbilicus, and carried down through the subcutaneous tissue with electrocautery.  Dissection was performed down to the level of the fascia, exposing the hernia sac.  The hernia sac was opened with care, and excess hernia sac was resected with electrocautery.  A finger was ran on the underlying peritoneum and this was clear.  A 4.3 cm Ventralex St Hernia Patch was placed and secured with 0 Ethibond sutures ensuring that it was against the peritoneal cavity.  The hernia defect was then closed with 0 Ethibond suture in an interrupted fashion over the patch.  The umbilicus was tacked to the fascia with a 3-0 Vicryl suture.   Hemostasis was confirmed. The skin was closed with a running 4-0 Monocryl suture and dermabond.  After the dermabond dried a 2X2 and tegaderm were placed over the umbilicus to act as a pressure dressing.      All counts were correct at the end of the case. The patient was awakened from anesthesia and extubated without complication.  The patient went to the PACU in stable condition.  Curlene Labrum, MD [facility] [address] [place], [place] [address] [phone] (office)

## 2022-05-20 ENCOUNTER — Encounter (HOSPITAL_COMMUNITY): Payer: Self-pay | Admitting: General Surgery

## 2022-05-25 DIAGNOSIS — B356 Tinea cruris: Secondary | ICD-10-CM | POA: Diagnosis not present

## 2022-05-25 DIAGNOSIS — R350 Frequency of micturition: Secondary | ICD-10-CM | POA: Diagnosis not present

## 2022-06-09 ENCOUNTER — Ambulatory Visit (INDEPENDENT_AMBULATORY_CARE_PROVIDER_SITE_OTHER): Payer: Medicare Other | Admitting: General Surgery

## 2022-06-09 ENCOUNTER — Encounter: Payer: Self-pay | Admitting: General Surgery

## 2022-06-09 VITALS — BP 145/81 | HR 54 | Temp 97.9°F | Resp 14 | Ht 68.0 in | Wt 215.0 lb

## 2022-06-09 DIAGNOSIS — K429 Umbilical hernia without obstruction or gangrene: Secondary | ICD-10-CM

## 2022-06-09 NOTE — Progress Notes (Signed)
[facility]  Doing great. No pain.  BP (!) 145/81   Pulse (!) 54   Temp 97.9 F (36.6 C) (Oral)   Resp 14   Ht '5\' 8"'$  (1.727 m)   Wt 215 lb (97.5 kg)   SpO2 96%   BMI 32.69 kg/m  Incision healing, no erythema or drainage, minor induration  Patient s/p umbilical hernia repair with mesh. Doing well. Induration will continue to improve. No heavy lifting > 10 lbs, excessive bending, pushing, pulling, or squatting for 6-8 weeks after surgery.  Ok to start mowing. Gradually start lifting over 10 lbs at the 6 week mark.  Curlene Labrum, MD [facility] [address] [place], [place] [address] [phone] (office)

## 2022-06-09 NOTE — Patient Instructions (Signed)
No heavy lifting > 10 lbs, excessive bending, pushing, pulling, or squatting for 6-8 weeks after surgery.   

## 2022-08-17 DIAGNOSIS — E785 Hyperlipidemia, unspecified: Secondary | ICD-10-CM | POA: Diagnosis not present

## 2022-08-17 DIAGNOSIS — I10 Essential (primary) hypertension: Secondary | ICD-10-CM | POA: Diagnosis not present

## 2022-08-17 DIAGNOSIS — R7301 Impaired fasting glucose: Secondary | ICD-10-CM | POA: Diagnosis not present

## 2022-08-25 DIAGNOSIS — E785 Hyperlipidemia, unspecified: Secondary | ICD-10-CM | POA: Diagnosis not present

## 2022-08-25 DIAGNOSIS — I10 Essential (primary) hypertension: Secondary | ICD-10-CM | POA: Diagnosis not present

## 2022-08-25 DIAGNOSIS — E782 Mixed hyperlipidemia: Secondary | ICD-10-CM | POA: Diagnosis not present

## 2022-08-25 DIAGNOSIS — R7301 Impaired fasting glucose: Secondary | ICD-10-CM | POA: Diagnosis not present

## 2022-08-25 IMAGING — DX DG ABDOMEN 1V
2 series · 2 of 2 positions shown · non-contrast
Comparison: 12/14/2018

CLINICAL DATA: Constipation, periumbilical pain for 1 month

EXAM:
ABDOMEN - 1 VIEW

[abdomen kub (1 of 2)]
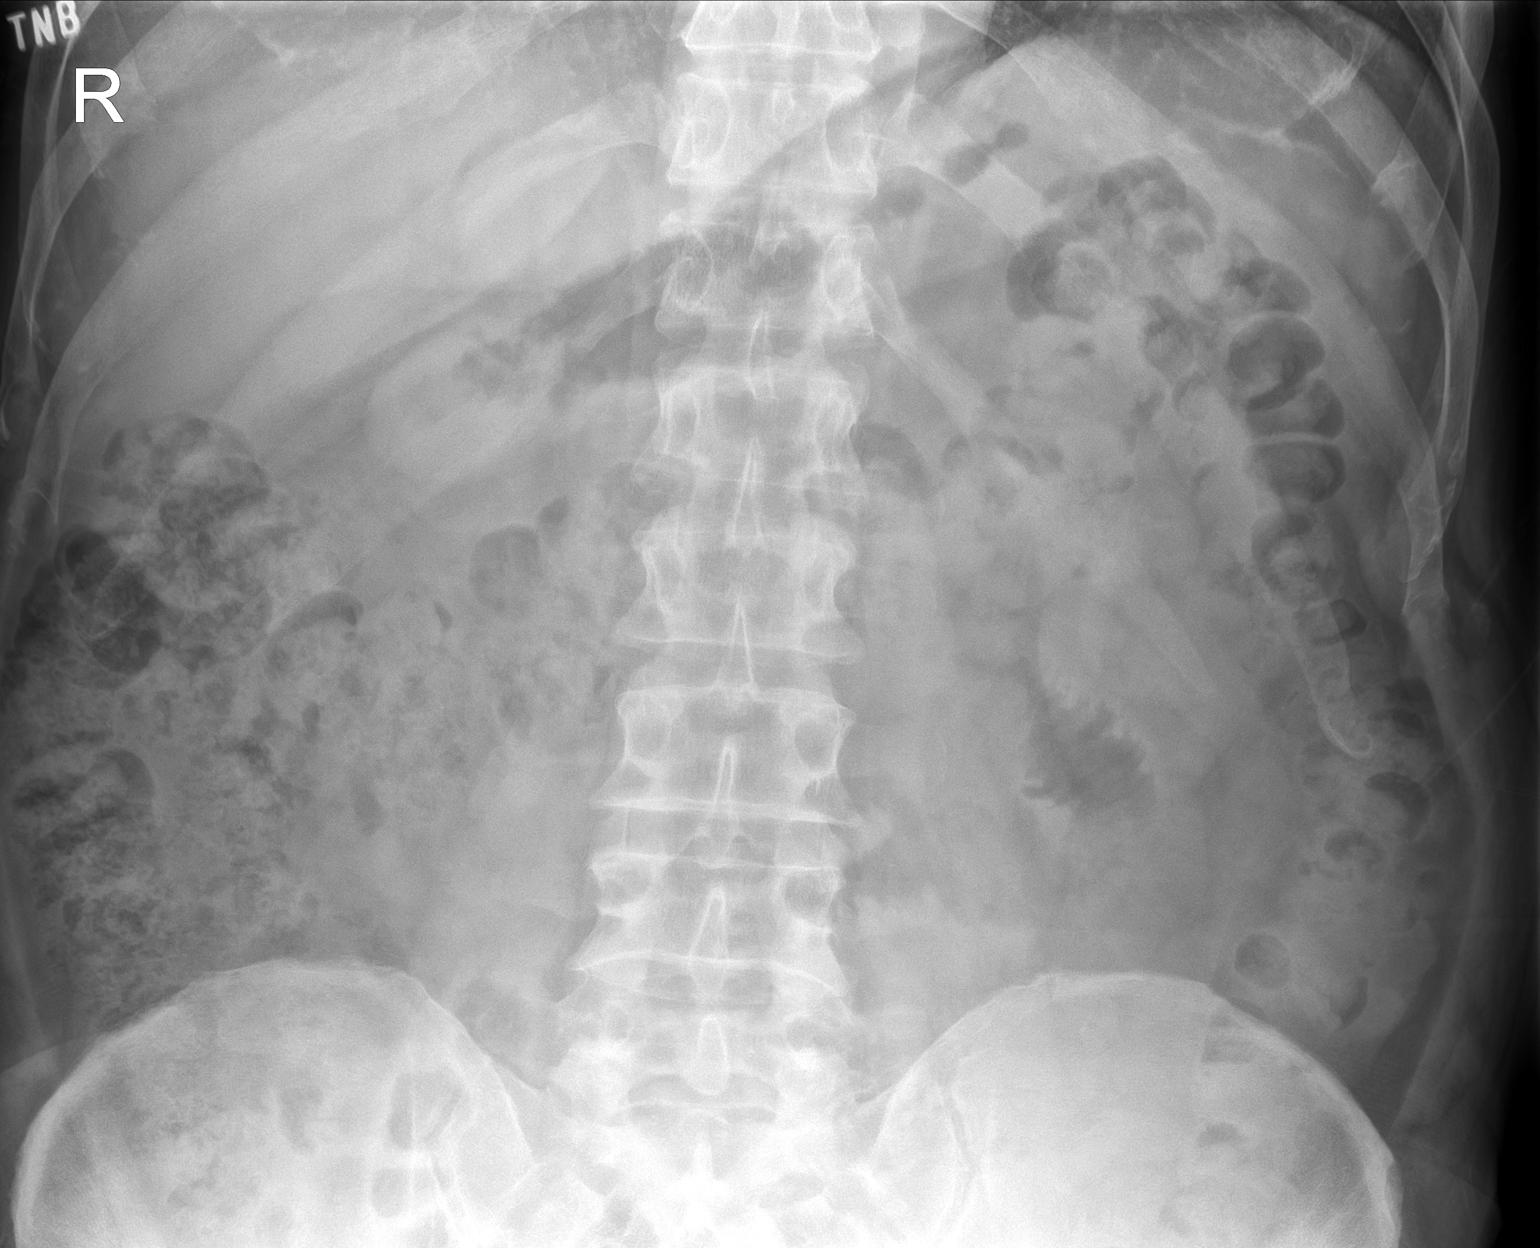

[abdomen kub (2 of 2)]
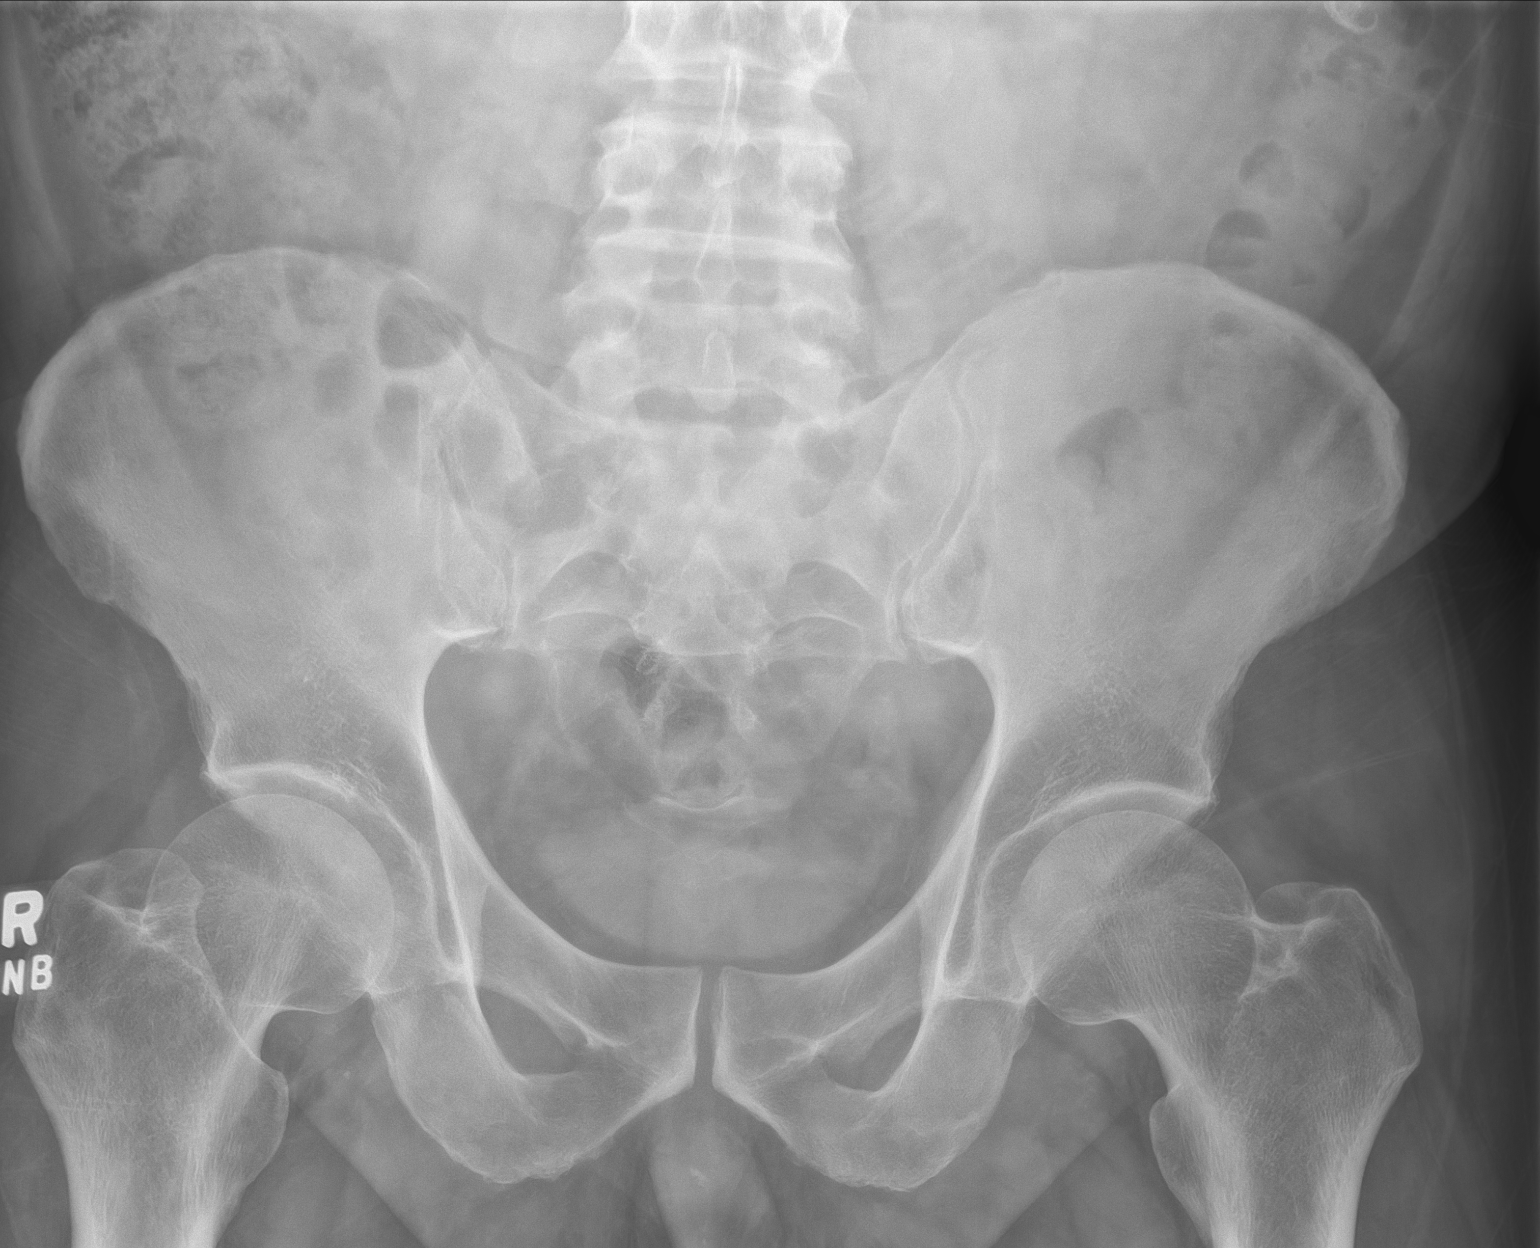

[2 of 2 positions shown; findings below may reference images not displayed]

FINDINGS: 2 supine frontal views of the abdomen and pelvis are obtained,
excluding portions of the right flank and hemidiaphragms by
collimation. No bowel obstruction or ileus. Minimal retained stool
within the proximal colon. No masses or abnormal calcifications. No
acute bony abnormalities.
IMPRESSION: 1. No bowel obstruction or ileus.
2. Minimal retained stool within the proximal colon.

## 2022-10-30 DIAGNOSIS — G4730 Sleep apnea, unspecified: Secondary | ICD-10-CM | POA: Diagnosis not present

## 2022-11-24 ENCOUNTER — Encounter: Payer: Self-pay | Admitting: Radiology

## 2023-02-15 DIAGNOSIS — L570 Actinic keratosis: Secondary | ICD-10-CM | POA: Diagnosis not present

## 2023-02-15 DIAGNOSIS — X32XXXA Exposure to sunlight, initial encounter: Secondary | ICD-10-CM | POA: Diagnosis not present

## 2023-02-15 DIAGNOSIS — C44622 Squamous cell carcinoma of skin of right upper limb, including shoulder: Secondary | ICD-10-CM | POA: Diagnosis not present

## 2023-03-03 DIAGNOSIS — G4733 Obstructive sleep apnea (adult) (pediatric): Secondary | ICD-10-CM | POA: Diagnosis not present

## 2023-03-08 DIAGNOSIS — R7301 Impaired fasting glucose: Secondary | ICD-10-CM | POA: Diagnosis not present

## 2023-03-08 DIAGNOSIS — E785 Hyperlipidemia, unspecified: Secondary | ICD-10-CM | POA: Diagnosis not present

## 2023-03-08 DIAGNOSIS — I10 Essential (primary) hypertension: Secondary | ICD-10-CM | POA: Diagnosis not present

## 2023-03-14 DIAGNOSIS — I10 Essential (primary) hypertension: Secondary | ICD-10-CM | POA: Diagnosis not present

## 2023-03-14 DIAGNOSIS — R7303 Prediabetes: Secondary | ICD-10-CM | POA: Diagnosis not present

## 2023-03-14 DIAGNOSIS — Z0001 Encounter for general adult medical examination with abnormal findings: Secondary | ICD-10-CM | POA: Diagnosis not present

## 2023-03-14 DIAGNOSIS — E785 Hyperlipidemia, unspecified: Secondary | ICD-10-CM | POA: Diagnosis not present

## 2023-03-14 DIAGNOSIS — R7301 Impaired fasting glucose: Secondary | ICD-10-CM | POA: Diagnosis not present

## 2023-03-14 DIAGNOSIS — E782 Mixed hyperlipidemia: Secondary | ICD-10-CM | POA: Diagnosis not present

## 2023-03-29 DIAGNOSIS — X32XXXD Exposure to sunlight, subsequent encounter: Secondary | ICD-10-CM | POA: Diagnosis not present

## 2023-03-29 DIAGNOSIS — Z08 Encounter for follow-up examination after completed treatment for malignant neoplasm: Secondary | ICD-10-CM | POA: Diagnosis not present

## 2023-03-29 DIAGNOSIS — L570 Actinic keratosis: Secondary | ICD-10-CM | POA: Diagnosis not present

## 2023-03-29 DIAGNOSIS — Z85828 Personal history of other malignant neoplasm of skin: Secondary | ICD-10-CM | POA: Diagnosis not present

## 2023-04-02 DIAGNOSIS — G4733 Obstructive sleep apnea (adult) (pediatric): Secondary | ICD-10-CM | POA: Diagnosis not present

## 2023-05-03 DIAGNOSIS — G4733 Obstructive sleep apnea (adult) (pediatric): Secondary | ICD-10-CM | POA: Diagnosis not present

## 2023-05-11 ENCOUNTER — Emergency Department (HOSPITAL_COMMUNITY): Payer: Medicare Other

## 2023-05-11 ENCOUNTER — Encounter (HOSPITAL_COMMUNITY): Payer: Self-pay | Admitting: Emergency Medicine

## 2023-05-11 ENCOUNTER — Other Ambulatory Visit: Payer: Self-pay

## 2023-05-11 ENCOUNTER — Emergency Department (HOSPITAL_COMMUNITY)
Admission: EM | Admit: 2023-05-11 | Discharge: 2023-05-11 | Disposition: A | Discharge status: Home / Self Care | Payer: Medicare Other | Attending: Emergency Medicine | Admitting: Emergency Medicine

## 2023-05-11 DIAGNOSIS — Z79899 Other long term (current) drug therapy: Secondary | ICD-10-CM | POA: Insufficient documentation

## 2023-05-11 DIAGNOSIS — I10 Essential (primary) hypertension: Secondary | ICD-10-CM | POA: Diagnosis not present

## 2023-05-11 DIAGNOSIS — U071 COVID-19: Secondary | ICD-10-CM | POA: Diagnosis not present

## 2023-05-11 DIAGNOSIS — G4730 Sleep apnea, unspecified: Secondary | ICD-10-CM | POA: Diagnosis not present

## 2023-05-11 DIAGNOSIS — R519 Headache, unspecified: Secondary | ICD-10-CM | POA: Diagnosis not present

## 2023-05-11 DIAGNOSIS — F17200 Nicotine dependence, unspecified, uncomplicated: Secondary | ICD-10-CM | POA: Diagnosis not present

## 2023-05-11 DIAGNOSIS — R918 Other nonspecific abnormal finding of lung field: Secondary | ICD-10-CM | POA: Diagnosis not present

## 2023-05-11 LAB — CBC
HCT: 45.9 % (ref 39.0–52.0)
Hemoglobin: 15.4 g/dL (ref 13.0–17.0)
MCH: 29.5 pg (ref 26.0–34.0)
MCHC: 33.6 g/dL (ref 30.0–36.0)
MCV: 87.9 fL (ref 80.0–100.0)
Platelets: 181 10*3/uL (ref 150–400)
RBC: 5.22 MIL/uL (ref 4.22–5.81)
RDW: 13.2 % (ref 11.5–15.5)
WBC: 7.4 10*3/uL (ref 4.0–10.5)
nRBC: 0 % (ref 0.0–0.2)

## 2023-05-11 LAB — BASIC METABOLIC PANEL
Anion gap: 12 (ref 5–15)
BUN: 16 mg/dL (ref 8–23)
CO2: 26 mmol/L (ref 22–32)
Calcium: 9 mg/dL (ref 8.9–10.3)
Chloride: 94 mmol/L — ABNORMAL LOW (ref 98–111)
Creatinine, Ser: 1.1 mg/dL (ref 0.61–1.24)
GFR, Estimated: >60 mL/min (ref >60)
Glucose, Bld: 120 mg/dL — ABNORMAL HIGH (ref 70–99)
Potassium: 3.2 mmol/L — ABNORMAL LOW (ref 3.5–5.1)
Sodium: 132 mmol/L — ABNORMAL LOW (ref 135–145)

## 2023-05-11 LAB — URINALYSIS, ROUTINE W REFLEX MICROSCOPIC
Bilirubin Urine: NEGATIVE
Glucose, UA: NEGATIVE mg/dL
Ketones, ur: NEGATIVE mg/dL
Leukocytes,Ua: NEGATIVE
Nitrite: NEGATIVE
Protein, ur: 30 mg/dL — AB
Specific Gravity, Urine: 1.021 (ref 1.005–1.030)
pH: 5 (ref 5.0–8.0)

## 2023-05-11 LAB — SARS CORONAVIRUS 2 BY RT PCR: SARS Coronavirus 2 by RT PCR: POSITIVE — AB

## 2023-05-11 LAB — CBG MONITORING, ED: Glucose-Capillary: 126 mg/dL — ABNORMAL HIGH (ref 70–99)

## 2023-05-11 MED ORDER — ACETAMINOPHEN 325 MG PO TABS
650.0000 mg | ORAL_TABLET | ORAL | Status: AC
  Administered 2023-05-11: 650 mg via ORAL
  Filled 2023-05-11: qty 2

## 2023-05-11 MED ORDER — POTASSIUM CHLORIDE CRYS ER 20 MEQ PO TBCR
40.0000 meq | EXTENDED_RELEASE_TABLET | Freq: Once | ORAL | Status: AC
  Administered 2023-05-11: 40 meq via ORAL
  Filled 2023-05-11: qty 2

## 2023-05-11 NOTE — Discharge Instructions (Signed)
Pleasure taking care of you today.  You were evaluated for symptoms of headache and fatigue.  You have COVID-19 infection.  Drink plenty of fluids and rest, you can take over-the-counter Tylenol or ibuprofen as needed for pain or fever.  Come back to the ER if you have shortness of breath, or if you develop any other new or worsening symptoms.

## 2023-05-11 NOTE — ED Notes (Signed)
Pt c/o increasing SOB, fatigues, headaches, and chills that started yesterday. Sts he has never had his COVID vaccines. Is a current smoker. Endorses feeling more SOB today. Wife at bedside. VS updated. Call bell within reach

## 2023-05-11 NOTE — ED Notes (Signed)
Pt was able to ambulate in the hallway. SpO2 maintained at 95% during ambulation. PA informed at bedside

## 2023-05-11 NOTE — ED Notes (Signed)
Discharge instructions provided by edp were discussed with pt. Pt verbalized understanding with no additional questions at this time. Pt to go home with s/o

## 2023-05-11 NOTE — ED Triage Notes (Signed)
Pt presents with headache, fatigue, SOB, since yesterday, recently visited family in hospital.

## 2023-05-11 NOTE — ED Provider Notes (Signed)
[place] EMERGENCY DEPARTMENT AT [facility] Provider Note   CSN: [id number] Arrival date & time: 05/11/23  1730     History  Chief Complaint  Patient presents with   Fatigue    Frank White is a 62 y.o. male.  He has history of hypertension, sleep apnea, is a smoker.  Presents the ER today complaining of headache, fatigue that started last night.  Also having chills.  No chest pain, mild shortness of breath at times.  No nausea or vomiting.  No dizziness or weakness.  Recently around people who are sick.  Did not get COVID vaccines.  HPI     Home Medications Prior to Admission medications   Medication Sig Start Date End Date Taking? Authorizing Provider  acetaminophen (TYLENOL) 325 MG tablet Take 650 mg by mouth daily as needed for moderate pain or headache.    [provider]  amLODipine (NORVASC) 2.5 MG tablet Take 2.5 mg by mouth 3 (three) times a week.    [provider]  butalbital-acetaminophen-caffeine (FIORICET, ESGIC) 50-325-40 MG tablet Take 1 tablet by mouth 2 (two) times daily as needed for headache.    [provider]  Carboxymethylcellul-Glycerin (LUBRICATING EYE DROPS OP) Place 1 drop into both eyes daily as needed (dry eyes).    [provider]  KRILL OIL PO Take by mouth.    [provider]  lisinopril-hydrochlorothiazide (PRINZIDE,ZESTORETIC) 20-25 MG tablet Take 1 tablet by mouth daily.    [provider]  potassium chloride SA (KLOR-CON M) 20 MEQ tablet Take 1 tablet by mouth tonight, 1 tablet by mouth  tomorrow morning, and 1 tablet by mouth tomorrow afternoon prior to surgery. 05/11/22   Lucretia Roers, MD  vitamin B-12 (CYANOCOBALAMIN) 500 MCG tablet Take 500 mcg by mouth daily.    [provider]      Allergies    Hydrocodone    Review of Systems   Review of Systems  Physical Exam Updated Vital Signs BP (!) 141/81   Pulse 61   Temp 99.6 F (37.6 C) (Oral)   Resp (!)  22   Ht 5\' 8"  (1.727 m)   Wt 98 kg   SpO2 93%   BMI 32.84 kg/m  Physical Exam Vitals and nursing note reviewed.  Constitutional:      General: He is not in acute distress.    Appearance: He is well-developed.  HENT:     Head: Normocephalic and atraumatic.     Mouth/Throat:     Mouth: Mucous membranes are moist.  Eyes:     Conjunctiva/sclera: Conjunctivae normal.  Cardiovascular:     Rate and Rhythm: Normal rate and regular rhythm.     Heart sounds: No murmur heard. Pulmonary:     Effort: Pulmonary effort is normal. No respiratory distress.     Breath sounds: Normal breath sounds. No wheezing, rhonchi or rales.  Abdominal:     Palpations: Abdomen is soft.     Tenderness: There is no abdominal tenderness.  Musculoskeletal:        General: No swelling.     Cervical back: Normal range of motion and neck supple.  Skin:    General: Skin is warm and dry.     Capillary Refill: Capillary refill takes less than 2 seconds.  Neurological:     General: No focal deficit present.     Mental Status: He is alert and oriented to person, place, and time.  Psychiatric:  Mood and Affect: Mood normal.     ED Results / Procedures / Treatments   Labs (all labs ordered are listed, but only abnormal results are displayed) Labs Reviewed  SARS CORONAVIRUS 2 BY RT PCR - Abnormal; Notable for the following components:      Result Value   SARS Coronavirus 2 by RT PCR POSITIVE (*)    All other components within normal limits  BASIC METABOLIC PANEL - Abnormal; Notable for the following components:   Sodium 132 (*)    Potassium 3.2 (*)    Chloride 94 (*)    Glucose, Bld 120 (*)    All other components within normal limits  URINALYSIS, ROUTINE W REFLEX MICROSCOPIC - Abnormal; Notable for the following components:   Hgb urine dipstick SMALL (*)    Protein, ur 30 (*)    Bacteria, UA RARE (*)    All other components within normal limits  CBG MONITORING, ED - Abnormal; Notable for the  following components:   Glucose-Capillary 126 (*)    All other components within normal limits  CBC    EKG None  Radiology DG Chest 1 View  Result Date: 05/11/2023 CLINICAL DATA:  [id number] COVID [id number] EXAM: CHEST  1 VIEW COMPARISON:  Chest x-ray 04/27/2014 FINDINGS: The heart and mediastinal contours are within normal limits. No focal consolidation. Increased interstitial markings with no overt pulmonary edema. No pleural effusion. No pneumothorax. No acute osseous abnormality. IMPRESSION: Findings suggestive of viral bronchiolitis versus reactive airway disease. Electronically Signed   By: Tish Frederickson M.D.   On: 05/11/2023 20:30    Procedures Procedures    Medications Ordered in ED Medications  acetaminophen (TYLENOL) tablet 650 mg (650 mg Oral Given 05/11/23 1805)  potassium chloride SA (KLOR-CON M) CR tablet 40 mEq (40 mEq Oral Given 05/11/23 2222)    ED Course/ Medical Decision Making/ A&P                                 Medical Decision Making Dx: COVID-19, influenza, URI, electrolyte abnormality, dehydration, other  ED course: Patient started having fatigue, headache, malaise since last night.  He is not hypoxic, able to ambulate on room air did not drop below 95%, chest x-ray does not show any pulmonary edema, no infiltrates, radiology findings consistent with a bronchiolitis or reactive airway disease.  He is not having any wheezing, he does smoke.  Discussed with patient that he is positive for COVID-19.  Otherwise labs are reassuring.  Mild hyponatremia which is repleted.  Patient was offered prescription for Paxlovid given his risk factors and unvaccinated status.  Discussed risks and benefits, discussed that it could reduce his risk of hospitalization or death but he states he does not want to take it.  He was informed that he has 5 days from the onset of symptoms to start taking it so if he starts feeling worse he can call his PCP to have them prescribe it.  He was  given strict return precautions.  Amount and/or Complexity of Data Reviewed Labs: ordered. Decision-making details documented in ED Course. Radiology: ordered and independent interpretation performed. Decision-making details documented in ED Course.  Risk OTC drugs. Prescription drug management.           Final Clinical Impression(s) / ED Diagnoses Final diagnoses:  None    Rx / DC Orders ED Discharge Orders     None  Josem Kaufmann 05/11/23 2240    Bethann Berkshire, MD 05/12/23 1102

## 2023-05-17 DIAGNOSIS — Z8616 Personal history of COVID-19: Secondary | ICD-10-CM | POA: Diagnosis not present

## 2023-05-17 DIAGNOSIS — Z7951 Long term (current) use of inhaled steroids: Secondary | ICD-10-CM | POA: Diagnosis not present

## 2023-05-17 DIAGNOSIS — H6092 Unspecified otitis externa, left ear: Secondary | ICD-10-CM | POA: Diagnosis not present

## 2023-05-17 DIAGNOSIS — R0600 Dyspnea, unspecified: Secondary | ICD-10-CM | POA: Diagnosis not present

## 2023-07-21 ENCOUNTER — Encounter (INDEPENDENT_AMBULATORY_CARE_PROVIDER_SITE_OTHER): Payer: Self-pay | Admitting: *Deleted

## 2023-08-30 DIAGNOSIS — X32XXXD Exposure to sunlight, subsequent encounter: Secondary | ICD-10-CM | POA: Diagnosis not present

## 2023-08-30 DIAGNOSIS — Z08 Encounter for follow-up examination after completed treatment for malignant neoplasm: Secondary | ICD-10-CM | POA: Diagnosis not present

## 2023-08-30 DIAGNOSIS — L570 Actinic keratosis: Secondary | ICD-10-CM | POA: Diagnosis not present

## 2023-08-30 DIAGNOSIS — Z85828 Personal history of other malignant neoplasm of skin: Secondary | ICD-10-CM | POA: Diagnosis not present

## 2023-08-30 DIAGNOSIS — K080 Exfoliation of teeth due to systemic causes: Secondary | ICD-10-CM | POA: Diagnosis not present

## 2023-09-02 DIAGNOSIS — K080 Exfoliation of teeth due to systemic causes: Secondary | ICD-10-CM | POA: Diagnosis not present

## 2023-09-14 DIAGNOSIS — K080 Exfoliation of teeth due to systemic causes: Secondary | ICD-10-CM | POA: Diagnosis not present

## 2023-09-18 DIAGNOSIS — R7301 Impaired fasting glucose: Secondary | ICD-10-CM | POA: Diagnosis not present

## 2023-09-18 DIAGNOSIS — I10 Essential (primary) hypertension: Secondary | ICD-10-CM | POA: Diagnosis not present

## 2023-09-18 DIAGNOSIS — E785 Hyperlipidemia, unspecified: Secondary | ICD-10-CM | POA: Diagnosis not present

## 2023-09-25 DIAGNOSIS — E782 Mixed hyperlipidemia: Secondary | ICD-10-CM | POA: Diagnosis not present

## 2023-09-25 DIAGNOSIS — Z0001 Encounter for general adult medical examination with abnormal findings: Secondary | ICD-10-CM | POA: Diagnosis not present

## 2023-09-25 DIAGNOSIS — I10 Essential (primary) hypertension: Secondary | ICD-10-CM | POA: Diagnosis not present

## 2023-09-25 DIAGNOSIS — B356 Tinea cruris: Secondary | ICD-10-CM | POA: Diagnosis not present

## 2023-09-25 DIAGNOSIS — R0600 Dyspnea, unspecified: Secondary | ICD-10-CM | POA: Diagnosis not present

## 2023-09-25 DIAGNOSIS — R7301 Impaired fasting glucose: Secondary | ICD-10-CM | POA: Diagnosis not present

## 2024-05-06 DIAGNOSIS — L821 Other seborrheic keratosis: Secondary | ICD-10-CM | POA: Diagnosis not present

## 2024-05-06 DIAGNOSIS — X32XXXD Exposure to sunlight, subsequent encounter: Secondary | ICD-10-CM | POA: Diagnosis not present

## 2024-05-06 DIAGNOSIS — L570 Actinic keratosis: Secondary | ICD-10-CM | POA: Diagnosis not present

## 2024-05-06 DIAGNOSIS — Z85828 Personal history of other malignant neoplasm of skin: Secondary | ICD-10-CM | POA: Diagnosis not present

## 2024-05-06 DIAGNOSIS — Z08 Encounter for follow-up examination after completed treatment for malignant neoplasm: Secondary | ICD-10-CM | POA: Diagnosis not present

## 2024-05-15 DIAGNOSIS — E785 Hyperlipidemia, unspecified: Secondary | ICD-10-CM | POA: Diagnosis not present

## 2024-05-15 DIAGNOSIS — R7303 Prediabetes: Secondary | ICD-10-CM | POA: Diagnosis not present

## 2024-05-15 DIAGNOSIS — Z125 Encounter for screening for malignant neoplasm of prostate: Secondary | ICD-10-CM | POA: Diagnosis not present

## 2024-05-15 DIAGNOSIS — I10 Essential (primary) hypertension: Secondary | ICD-10-CM | POA: Diagnosis not present

## 2024-05-15 DIAGNOSIS — R7301 Impaired fasting glucose: Secondary | ICD-10-CM | POA: Diagnosis not present

## 2024-05-22 DIAGNOSIS — F17200 Nicotine dependence, unspecified, uncomplicated: Secondary | ICD-10-CM | POA: Diagnosis not present

## 2024-05-22 DIAGNOSIS — R0600 Dyspnea, unspecified: Secondary | ICD-10-CM | POA: Diagnosis not present

## 2024-05-22 DIAGNOSIS — E876 Hypokalemia: Secondary | ICD-10-CM | POA: Diagnosis not present

## 2024-05-22 DIAGNOSIS — I10 Essential (primary) hypertension: Secondary | ICD-10-CM | POA: Diagnosis not present

## 2024-05-22 DIAGNOSIS — E782 Mixed hyperlipidemia: Secondary | ICD-10-CM | POA: Diagnosis not present

## 2024-05-22 DIAGNOSIS — R7303 Prediabetes: Secondary | ICD-10-CM | POA: Diagnosis not present

## 2024-05-22 DIAGNOSIS — R7301 Impaired fasting glucose: Secondary | ICD-10-CM | POA: Diagnosis not present

## 2024-06-07 DIAGNOSIS — E876 Hypokalemia: Secondary | ICD-10-CM | POA: Diagnosis not present

## 2024-07-13 DIAGNOSIS — K080 Exfoliation of teeth due to systemic causes: Secondary | ICD-10-CM | POA: Diagnosis not present

## 2024-08-14 DIAGNOSIS — K080 Exfoliation of teeth due to systemic causes: Secondary | ICD-10-CM | POA: Diagnosis not present

## 2024-10-11 ENCOUNTER — Encounter: Payer: Self-pay | Admitting: *Deleted

## 2024-10-11 NOTE — Progress Notes (Signed)
 Frank White                                          MRN: [id number]   10/11/2024   The [facility] Quality Team Specialist reviewed this patient medical record for the purposes of chart review for care gap closure. The following were reviewed: chart review for care gap closure-controlling blood pressure.    [facility] Quality Team

## 2024-10-30 ENCOUNTER — Other Ambulatory Visit (HOSPITAL_COMMUNITY): Payer: Self-pay

## 2024-10-30 DIAGNOSIS — E782 Mixed hyperlipidemia: Secondary | ICD-10-CM

## 2024-11-15 ENCOUNTER — Other Ambulatory Visit (HOSPITAL_COMMUNITY)
